# Patient Record
Sex: Male | Born: 1980 | Race: White | Hispanic: No | Marital: Single | Smoking: Former smoker
Health system: Southern US, Community
[De-identification: ages and names within clinical notes are randomized; demographics above are authoritative.]

## PROBLEM LIST (undated history)

## (undated) DIAGNOSIS — S62309A Unspecified fracture of unspecified metacarpal bone, initial encounter for closed fracture: Secondary | ICD-10-CM

## (undated) DIAGNOSIS — S62009A Unspecified fracture of navicular [scaphoid] bone of unspecified wrist, initial encounter for closed fracture: Secondary | ICD-10-CM

## (undated) DIAGNOSIS — J302 Other seasonal allergic rhinitis: Secondary | ICD-10-CM

## (undated) DIAGNOSIS — Z98811 Dental restoration status: Secondary | ICD-10-CM

## (undated) HISTORY — PX: LASIK: SHX215

## (undated) HISTORY — PX: UPPER GI ENDOSCOPY: SHX6162

## (undated) HISTORY — PX: ORIF FOREARM FRACTURE: SHX2124

---

## 2004-06-09 HISTORY — PX: RETINAL DETACHMENT SURGERY: SHX105

## 2005-06-09 HISTORY — PX: FINGER AMPUTATION: SHX636

## 2005-06-09 HISTORY — PX: FINGER REPLANTATION: SHX639

## 2012-09-14 ENCOUNTER — Other Ambulatory Visit: Payer: Self-pay | Admitting: Neurosurgery

## 2012-09-29 ENCOUNTER — Encounter (HOSPITAL_COMMUNITY)
Admission: RE | Admit: 2012-09-29 | Discharge: 2012-09-29 | Disposition: A | Discharge status: Home / Self Care | Payer: 59 | Source: Ambulatory Visit | Attending: Neurosurgery | Admitting: Neurosurgery

## 2012-09-29 ENCOUNTER — Encounter (HOSPITAL_COMMUNITY): Payer: Self-pay

## 2012-09-29 LAB — CBC
MCHC: 35.6 g/dL (ref 30.0–36.0)
MCV: 89.3 fL (ref 78.0–100.0)
Platelets: 287 10*3/uL (ref 150–400)
RDW: 12.8 % (ref 11.5–15.5)
WBC: 8.5 10*3/uL (ref 4.0–10.5)

## 2012-09-29 LAB — SURGICAL PCR SCREEN: MRSA, PCR: NEGATIVE

## 2012-09-29 NOTE — Pre-Procedure Instructions (Addendum)
Omar Rollins  09/29/2012   Your procedure is scheduled on: 10/07/12  Report to [facility] a 800  AM.  Call this number if you have problems the morning of surgery: [phone]   Remember:   Do not eat food or drink liquids after midnight.   Take these medicines the morning of surgery with A SIP OF WATER: none   Do not wear jewelry, make-up or nail polish.  Do not wear lotions, powders, or perfumes. You may wear deodorant.  Do not shave 48 hours prior to surgery. Men may shave face and neck.  Do not bring valuables to the hospital.  Contacts, dentures or bridgework may not be worn into surgery.  Leave suitcase in the car. After surgery it may be brought to your room.  For patients admitted to the hospital, checkout time is 11:00 AM the day of  discharge.   Patients discharged the day of surgery will not be allowed to drive  home.  Name and phone number of your driver:   Special Instructions: Shower using CHG 2 nights before surgery and the night before surgery.  If you shower the day of surgery use CHG.  Use special wash - you have one bottle of CHG for all showers.  You should use approximately 1/3 of the bottle for each shower.   Please read over the following fact sheets that you were given: Pain Booklet, Coughing and Deep Breathing, MRSA Information and Surgical Site Infection Prevention

## 2012-10-06 MED ORDER — CEFAZOLIN SODIUM-DEXTROSE 2-3 GM-% IV SOLR
2.0000 g | INTRAVENOUS | Status: AC
  Administered 2012-10-07: 2 g via INTRAVENOUS
  Filled 2012-10-06: qty 50
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
## 2012-10-07 ENCOUNTER — Encounter (HOSPITAL_COMMUNITY)
Admission: RE | Disposition: A | Discharge status: Home / Self Care | Payer: Self-pay | Source: Ambulatory Visit | Attending: Neurosurgery

## 2012-10-07 ENCOUNTER — Ambulatory Visit (HOSPITAL_COMMUNITY): Payer: 59

## 2012-10-07 ENCOUNTER — Ambulatory Visit (HOSPITAL_COMMUNITY)
Admission: RE | Admit: 2012-10-07 | Discharge: 2012-10-07 | Disposition: A | Discharge status: Home / Self Care | Payer: 59 | Source: Ambulatory Visit | Attending: Neurosurgery | Admitting: Neurosurgery

## 2012-10-07 ENCOUNTER — Ambulatory Visit (HOSPITAL_COMMUNITY): Payer: 59 | Admitting: Certified Registered"

## 2012-10-07 ENCOUNTER — Encounter (HOSPITAL_COMMUNITY): Payer: Self-pay | Admitting: Certified Registered"

## 2012-10-07 ENCOUNTER — Encounter (HOSPITAL_COMMUNITY): Payer: Self-pay | Admitting: *Deleted

## 2012-10-07 DIAGNOSIS — M5030 Other cervical disc degeneration, unspecified cervical region: Secondary | ICD-10-CM | POA: Insufficient documentation

## 2012-10-07 DIAGNOSIS — Z01812 Encounter for preprocedural laboratory examination: Secondary | ICD-10-CM | POA: Insufficient documentation

## 2012-10-07 DIAGNOSIS — M47812 Spondylosis without myelopathy or radiculopathy, cervical region: Secondary | ICD-10-CM | POA: Insufficient documentation

## 2012-10-07 DIAGNOSIS — M5020 Other cervical disc displacement, unspecified cervical region: Secondary | ICD-10-CM | POA: Insufficient documentation

## 2012-10-07 HISTORY — PX: CERVICAL DISC ARTHROPLASTY: SHX587

## 2012-10-07 SURGERY — CERVICAL ANTERIOR DISC ARTHROPLASTY
Anesthesia: General | Site: Spine Cervical | Wound class: Clean

## 2012-10-07 MED ORDER — ONDANSETRON HCL 4 MG/2ML IJ SOLN: 4.0000 mg | INTRAMUSCULAR | Status: DC | PRN

## 2012-10-07 MED ORDER — OXYCODONE HCL 5 MG PO TABS: 5.0000 mg | ORAL_TABLET | Freq: Once | ORAL | Status: DC | PRN

## 2012-10-07 MED ORDER — MEPERIDINE HCL 25 MG/ML IJ SOLN: 6.2500 mg | INTRAMUSCULAR | Status: DC | PRN

## 2012-10-07 MED ORDER — SENNOSIDES-DOCUSATE SODIUM 8.6-50 MG PO TABS
1.0000 | ORAL_TABLET | Freq: Every evening | ORAL | Status: DC | PRN
  Filled 2012-10-07: qty 1

## 2012-10-07 MED ORDER — KCL IN DEXTROSE-NACL 20-5-0.45 MEQ/L-%-% IV SOLN
INTRAVENOUS | Status: DC
  Filled 2012-10-07 (×2): qty 1000

## 2012-10-07 MED ORDER — HYDROMORPHONE HCL PF 1 MG/ML IJ SOLN
INTRAMUSCULAR | Status: DC | PRN
Start: 2012-10-07 — End: 2012-10-07
  Administered 2012-10-07: .5 mg via INTRAVENOUS

## 2012-10-07 MED ORDER — MENTHOL 3 MG MT LOZG: 1.0000 | LOZENGE | OROMUCOSAL | Status: DC | PRN

## 2012-10-07 MED ORDER — SODIUM CHLORIDE 0.9 % IJ SOLN: 3.0000 mL | INTRAMUSCULAR | Status: DC | PRN

## 2012-10-07 MED ORDER — MIDAZOLAM HCL 5 MG/5ML IJ SOLN
INTRAMUSCULAR | Status: DC | PRN
  Administered 2012-10-07: 2 mg via INTRAVENOUS

## 2012-10-07 MED ORDER — KETOROLAC TROMETHAMINE 30 MG/ML IJ SOLN: 30.0000 mg | Freq: Four times a day (QID) | INTRAMUSCULAR | Status: DC

## 2012-10-07 MED ORDER — FLEET ENEMA 7-19 GM/118ML RE ENEM
1.0000 | ENEMA | Freq: Once | RECTAL | Status: DC | PRN
  Filled 2012-10-07: qty 1

## 2012-10-07 MED ORDER — PROPOFOL 10 MG/ML IV BOLUS
INTRAVENOUS | Status: DC | PRN
  Administered 2012-10-07: 200 mg via INTRAVENOUS

## 2012-10-07 MED ORDER — LIDOCAINE-EPINEPHRINE 1 %-1:100000 IJ SOLN
INTRAMUSCULAR | Status: DC | PRN
  Administered 2012-10-07: 2 mL

## 2012-10-07 MED ORDER — SENNA 8.6 MG PO TABS: 1.0000 | ORAL_TABLET | Freq: Two times a day (BID) | ORAL | Status: DC

## 2012-10-07 MED ORDER — OXYCODONE HCL 5 MG PO TABS
ORAL_TABLET | ORAL | Status: AC
  Administered 2012-10-07: 5 mg
  Filled 2012-10-07: qty 1

## 2012-10-07 MED ORDER — MIDAZOLAM HCL 2 MG/2ML IJ SOLN: 0.5000 mg | Freq: Once | INTRAMUSCULAR | Status: DC | PRN

## 2012-10-07 MED ORDER — DEXAMETHASONE SODIUM PHOSPHATE 4 MG/ML IJ SOLN
INTRAMUSCULAR | Status: DC | PRN
  Administered 2012-10-07: 10 mg via INTRAVENOUS

## 2012-10-07 MED ORDER — KETOROLAC TROMETHAMINE 30 MG/ML IJ SOLN: 30.0000 mg | Freq: Once | INTRAMUSCULAR | Status: DC

## 2012-10-07 MED ORDER — ACETAMINOPHEN 325 MG PO TABS: 650.0000 mg | ORAL_TABLET | ORAL | Status: DC | PRN

## 2012-10-07 MED ORDER — PANTOPRAZOLE SODIUM 40 MG IV SOLR
40.0000 mg | Freq: Every day | INTRAVENOUS | Status: DC
  Filled 2012-10-07: qty 40

## 2012-10-07 MED ORDER — PHENOL 1.4 % MT LIQD: 1.0000 | OROMUCOSAL | Status: DC | PRN

## 2012-10-07 MED ORDER — HYDROMORPHONE HCL PF 1 MG/ML IJ SOLN
INTRAMUSCULAR | Status: AC
  Filled 2012-10-07: qty 1

## 2012-10-07 MED ORDER — SODIUM CHLORIDE 0.9 % IJ SOLN: 3.0000 mL | Freq: Two times a day (BID) | INTRAMUSCULAR | Status: DC

## 2012-10-07 MED ORDER — HEMOSTATIC AGENTS (NO CHARGE) OPTIME
TOPICAL | Status: DC | PRN
  Administered 2012-10-07: 1 via TOPICAL

## 2012-10-07 MED ORDER — KETOROLAC TROMETHAMINE 30 MG/ML IJ SOLN
INTRAMUSCULAR | Status: AC
  Administered 2012-10-07: 30 mg
  Filled 2012-10-07: qty 1

## 2012-10-07 MED ORDER — LACTATED RINGERS IV SOLN
INTRAVENOUS | Status: DC | PRN
  Administered 2012-10-07 (×2): via INTRAVENOUS

## 2012-10-07 MED ORDER — CEFAZOLIN SODIUM 1-5 GM-% IV SOLN
1.0000 g | Freq: Three times a day (TID) | INTRAVENOUS | Status: DC
  Filled 2012-10-07 (×2): qty 50

## 2012-10-07 MED ORDER — BUPIVACAINE HCL 0.5 % IJ SOLN
INTRAMUSCULAR | Status: DC | PRN
  Administered 2012-10-07: 2 mL

## 2012-10-07 MED ORDER — HYDROCODONE-ACETAMINOPHEN 5-325 MG PO TABS: 1.0000 | ORAL_TABLET | ORAL | Status: DC | PRN

## 2012-10-07 MED ORDER — FENTANYL CITRATE 0.05 MG/ML IJ SOLN
INTRAMUSCULAR | Status: DC | PRN
  Administered 2012-10-07: 250 ug via INTRAVENOUS

## 2012-10-07 MED ORDER — ROCURONIUM BROMIDE 100 MG/10ML IV SOLN
INTRAVENOUS | Status: DC | PRN
  Administered 2012-10-07: 50 mg via INTRAVENOUS

## 2012-10-07 MED ORDER — DOCUSATE SODIUM 100 MG PO CAPS: 100.0000 mg | ORAL_CAPSULE | Freq: Two times a day (BID) | ORAL | Status: DC

## 2012-10-07 MED ORDER — ONDANSETRON HCL 4 MG/2ML IJ SOLN
INTRAMUSCULAR | Status: DC | PRN
  Administered 2012-10-07: 4 mg via INTRAVENOUS

## 2012-10-07 MED ORDER — ALUM & MAG HYDROXIDE-SIMETH 200-200-20 MG/5ML PO SUSP: 30.0000 mL | Freq: Four times a day (QID) | ORAL | Status: DC | PRN

## 2012-10-07 MED ORDER — HYDROMORPHONE HCL PF 1 MG/ML IJ SOLN
0.2500 mg | INTRAMUSCULAR | Status: DC | PRN
  Administered 2012-10-07: 0.5 mg via INTRAVENOUS

## 2012-10-07 MED ORDER — EPHEDRINE SULFATE 50 MG/ML IJ SOLN
INTRAMUSCULAR | Status: DC | PRN
  Administered 2012-10-07: 10 mg via INTRAVENOUS

## 2012-10-07 MED ORDER — SODIUM CHLORIDE 0.9 % IV SOLN: 250.0000 mL | INTRAVENOUS | Status: DC

## 2012-10-07 MED ORDER — KCL IN DEXTROSE-NACL 20-5-0.45 MEQ/L-%-% IV SOLN
INTRAVENOUS | Status: AC
  Filled 2012-10-07: qty 1000

## 2012-10-07 MED ORDER — MORPHINE SULFATE 2 MG/ML IJ SOLN
1.0000 mg | INTRAMUSCULAR | Status: DC | PRN
  Administered 2012-10-07: 2 mg via INTRAVENOUS
  Filled 2012-10-07: qty 1

## 2012-10-07 MED ORDER — GLYCOPYRROLATE 0.2 MG/ML IJ SOLN
INTRAMUSCULAR | Status: DC | PRN
  Administered 2012-10-07: .5 mg via INTRAVENOUS

## 2012-10-07 MED ORDER — ACETAMINOPHEN 650 MG RE SUPP: 650.0000 mg | RECTAL | Status: DC | PRN

## 2012-10-07 MED ORDER — 0.9 % SODIUM CHLORIDE (POUR BTL) OPTIME
TOPICAL | Status: DC | PRN
  Administered 2012-10-07: 1000 mL

## 2012-10-07 MED ORDER — PROMETHAZINE HCL 25 MG/ML IJ SOLN: 6.2500 mg | INTRAMUSCULAR | Status: DC | PRN

## 2012-10-07 MED ORDER — THROMBIN 5000 UNITS EX SOLR
CUTANEOUS | Status: DC | PRN
  Administered 2012-10-07 (×2): 5000 [IU] via TOPICAL

## 2012-10-07 MED ORDER — DIAZEPAM 5 MG PO TABS: 5.0000 mg | ORAL_TABLET | Freq: Four times a day (QID) | ORAL | Status: DC | PRN

## 2012-10-07 MED ORDER — NEOSTIGMINE METHYLSULFATE 1 MG/ML IJ SOLN
INTRAMUSCULAR | Status: DC | PRN
  Administered 2012-10-07: 4 mg via INTRAVENOUS

## 2012-10-07 MED ORDER — OXYCODONE-ACETAMINOPHEN 5-325 MG PO TABS: 1.0000 | ORAL_TABLET | ORAL | Status: DC | PRN

## 2012-10-07 MED ORDER — OXYCODONE HCL 5 MG/5ML PO SOLN: 5.0000 mg | Freq: Once | ORAL | Status: DC | PRN

## 2012-10-07 MED ORDER — ZOLPIDEM TARTRATE 5 MG PO TABS: 5.0000 mg | ORAL_TABLET | Freq: Every evening | ORAL | Status: DC | PRN

## 2012-10-07 MED ORDER — BISACODYL 10 MG RE SUPP: 10.0000 mg | Freq: Every day | RECTAL | Status: DC | PRN

## 2012-10-07 MED ORDER — LIDOCAINE HCL (CARDIAC) 20 MG/ML IV SOLN
INTRAVENOUS | Status: DC | PRN
  Administered 2012-10-07: 30 mg via INTRAVENOUS

## 2012-10-07 SURGICAL SUPPLY — 63 items
BAG DECANTER FOR FLEXI CONT (MISCELLANEOUS) IMPLANT
BENZOIN TINCTURE PRP APPL 2/3 (GAUZE/BANDAGES/DRESSINGS) IMPLANT
BIT DRILL NEURO 2X3.1 SFT TUCH (MISCELLANEOUS) ×1 IMPLANT
BIT MILLING PRODISC 2.0 STER (BIT) ×2 IMPLANT
BLADE ULTRA TIP 2M (BLADE) ×2 IMPLANT
BUR BARREL STRAIGHT FLUTE 4.0 (BURR) ×2 IMPLANT
CANISTER SUCTION 2500CC (MISCELLANEOUS) ×2 IMPLANT
CLOTH BEACON ORANGE TIMEOUT ST (SAFETY) ×2 IMPLANT
CONT SPEC 4OZ CLIKSEAL STRL BL (MISCELLANEOUS) ×2 IMPLANT
DERMABOND ADHESIVE PROPEN (GAUZE/BANDAGES/DRESSINGS) ×1
DERMABOND ADVANCED (GAUZE/BANDAGES/DRESSINGS)
DERMABOND ADVANCED .7 DNX12 (GAUZE/BANDAGES/DRESSINGS) IMPLANT
DERMABOND ADVANCED .7 DNX6 (GAUZE/BANDAGES/DRESSINGS) ×1 IMPLANT
DISC PRODISC-C MED 5MM (Neuro Prosthesis/Implant) ×2 IMPLANT
DRAPE C-ARM 42X72 X-RAY (DRAPES) ×4 IMPLANT
DRAPE LAPAROTOMY 100X72 PEDS (DRAPES) ×2 IMPLANT
DRAPE MICROSCOPE LEICA (MISCELLANEOUS) ×2 IMPLANT
DRAPE POUCH INSTRU U-SHP 10X18 (DRAPES) ×2 IMPLANT
DRAPE PROXIMA HALF (DRAPES) IMPLANT
DRESSING TELFA 8X3 (GAUZE/BANDAGES/DRESSINGS) IMPLANT
DRILL NEURO 2X3.1 SOFT TOUCH (MISCELLANEOUS) ×2
DRSG PAD ABDOMINAL 8X10 ST (GAUZE/BANDAGES/DRESSINGS) IMPLANT
DURAPREP 6ML APPLICATOR 50/CS (WOUND CARE) ×2 IMPLANT
ELECT COATED BLADE 2.86 ST (ELECTRODE) ×2 IMPLANT
ELECT REM PT RETURN 9FT ADLT (ELECTROSURGICAL) ×2
ELECTRODE REM PT RTRN 9FT ADLT (ELECTROSURGICAL) ×1 IMPLANT
GAUZE SPONGE 4X4 16PLY XRAY LF (GAUZE/BANDAGES/DRESSINGS) IMPLANT
GLOVE BIO SURGEON STRL SZ8 (GLOVE) ×2 IMPLANT
GLOVE BIOGEL PI IND STRL 7.0 (GLOVE) ×1 IMPLANT
GLOVE BIOGEL PI IND STRL 8 (GLOVE) ×2 IMPLANT
GLOVE BIOGEL PI IND STRL 8.5 (GLOVE) ×1 IMPLANT
GLOVE BIOGEL PI INDICATOR 7.0 (GLOVE) ×1
GLOVE BIOGEL PI INDICATOR 8 (GLOVE) ×2
GLOVE BIOGEL PI INDICATOR 8.5 (GLOVE) ×1
GLOVE ECLIPSE 7.5 STRL STRAW (GLOVE) ×6 IMPLANT
GLOVE ECLIPSE 8.0 STRL XLNG CF (GLOVE) ×2 IMPLANT
GLOVE EXAM NITRILE LRG STRL (GLOVE) IMPLANT
GLOVE EXAM NITRILE MD LF STRL (GLOVE) IMPLANT
GLOVE EXAM NITRILE XL STR (GLOVE) IMPLANT
GLOVE EXAM NITRILE XS STR PU (GLOVE) IMPLANT
GOWN BRE IMP SLV AUR LG STRL (GOWN DISPOSABLE) ×4 IMPLANT
GOWN BRE IMP SLV AUR XL STRL (GOWN DISPOSABLE) ×2 IMPLANT
GOWN STRL REIN 2XL LVL4 (GOWN DISPOSABLE) ×4 IMPLANT
INSERTER TIP F/MEDIUM 5MM HEIGHT ×2 IMPLANT
KIT BASIN OR (CUSTOM PROCEDURE TRAY) ×2 IMPLANT
KIT ROOM TURNOVER OR (KITS) ×2 IMPLANT
NEEDLE HYPO 25X1 1.5 SAFETY (NEEDLE) ×2 IMPLANT
NEEDLE SPNL 22GX3.5 QUINCKE BK (NEEDLE) ×2 IMPLANT
NS IRRIG 1000ML POUR BTL (IV SOLUTION) ×2 IMPLANT
PACK LAMINECTOMY NEURO (CUSTOM PROCEDURE TRAY) ×2 IMPLANT
PAD ARMBOARD 7.5X6 YLW CONV (MISCELLANEOUS) ×6 IMPLANT
RUBBERBAND STERILE (MISCELLANEOUS) ×4 IMPLANT
SPONGE GAUZE 4X4 12PLY (GAUZE/BANDAGES/DRESSINGS) IMPLANT
SPONGE INTESTINAL PEANUT (DISPOSABLE) ×2 IMPLANT
SPONGE SURGIFOAM ABS GEL SZ50 (HEMOSTASIS) ×2 IMPLANT
STRIP CLOSURE SKIN 1/2X4 (GAUZE/BANDAGES/DRESSINGS) IMPLANT
SUT VIC AB 3-0 SH 8-18 (SUTURE) ×2 IMPLANT
SYR 20ML ECCENTRIC (SYRINGE) ×2 IMPLANT
TAPE CLOTH 3X10 TAN LF (GAUZE/BANDAGES/DRESSINGS) IMPLANT
TOWEL OR 17X24 6PK STRL BLUE (TOWEL DISPOSABLE) ×2 IMPLANT
TOWEL OR 17X26 10 PK STRL BLUE (TOWEL DISPOSABLE) ×2 IMPLANT
TRAP SPECIMEN MUCOUS 40CC (MISCELLANEOUS) ×2 IMPLANT
WATER STERILE IRR 1000ML POUR (IV SOLUTION) ×2 IMPLANT

## 2012-10-07 NOTE — Interval H&P Note (Signed)
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 After consideration of risks, benefits and other options for treatment, the patient has consented to  Procedure(s) with comments: CERVICAL ANTERIOR DISC ARTHROPLASTY (N/A) - C6-7 Arthroplasty with Prodisc as a surgical intervention .  The patient's history has been reviewed, patient examined, no change in status, stable for surgery.  I have reviewed the patient's chart and labs.  Questions were answered to the patient's satisfaction.     Simpson Paulos D

## 2012-10-07 NOTE — Anesthesia Preprocedure Evaluation (Signed)
Anesthesia Evaluation  Patient identified by MRN, date of birth, ID band Patient awake    Reviewed: Allergy & Precautions, H&P , NPO status , Patient's Chart, lab work & pertinent test results  History of Anesthesia Complications Negative for: history of anesthetic complications  Airway Mallampati: I TM Distance: >3 FB Neck ROM: Full    Dental  (+) Dental Advisory Given   Pulmonary former smoker,  breath sounds clear to auscultation  Pulmonary exam normal       Cardiovascular negative cardio ROS  Rhythm:Regular Rate:Normal     Neuro/Psych negative neurological ROS     GI/Hepatic Neg liver ROS, GERD-  Controlled,  Endo/Other  negative endocrine ROS  Renal/GU negative Renal ROS     Musculoskeletal   Abdominal   Peds  Hematology   Anesthesia Other Findings   Reproductive/Obstetrics                           Anesthesia Physical Anesthesia Plan  ASA: II  Anesthesia Plan: General   Post-op Pain Management:    Induction: Intravenous  Airway Management Planned: Oral ETT  Additional Equipment:   Intra-op Plan:   Post-operative Plan: Extubation in OR  Informed Consent: I have reviewed the patients History and Physical, chart, labs and discussed the procedure including the risks, benefits and alternatives for the proposed anesthesia with the patient or authorized representative who has indicated his/her understanding and acceptance.   Dental advisory given  Plan Discussed with: Surgeon and CRNA  Anesthesia Plan Comments: (Plan routine monitors, GETA)        Anesthesia Quick Evaluation

## 2012-10-07 NOTE — Discharge Summary (Signed)
Physician Discharge Summary  Patient ID: Omar Rollins MRN: [id number] DOB/AGE: 12-27-1980 32 y.o.  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 *   Discharged Condition: good  Hospital Course: Medardo Hassing was admitted for surgery with Dx HNP C6-7. Following uncomplicated arthroplasty C6-7, he recovered in Neuro PACU before transfer to 3500 for observation. He has progressed nicely.  Consults: None  Significant Diagnostic Studies: radiology: X-Ray: Intra-operative  Treatments: surgery: CERVICAL ANTERIOR DISC ARTHROPLASTY (N/A) - Cervical six-seven Arthroplasty with ProdiscC.Anterior cervical decompression with disc arthroplasty    Discharge Exam: Blood pressure 122/82, pulse 65, temperature 98.3 F (36.8 C), temperature source Oral, resp. rate 16, SpO2 99.00%. Alert, conversant. Good strength BUE. Incision with Dermabond, no erythema, swelling, or drainage. Denies dysphagia. Mild left scapular pain persists as expected.     Disposition: Discharge to home. Pt & family verbalize understanding of d/c instructions.   Rx's Percocet 5/325 & Valium 5mg  prn.       Medication List     As of 10/07/2012  6:46 PM    Notice      You have not been prescribed any medications.          Signed: Georgiann Cocker 10/07/2012, 6:46 PM  As above.  Patient doing well.  Strength in left triceps fully improved.

## 2012-10-07 NOTE — Anesthesia Procedure Notes (Signed)
Procedure Name: Intubation Date/Time: 10/07/2012 12:54 PM Performed by: Jerilee Hoh Pre-anesthesia Checklist: Patient identified, Emergency Drugs available, Suction available and Patient being monitored Patient Re-evaluated:Patient Re-evaluated prior to inductionOxygen Delivery Method: Circle system utilized Preoxygenation: Pre-oxygenation with 100% oxygen Intubation Type: IV induction Ventilation: Mask ventilation without difficulty Laryngoscope Size: Mac and 4 Grade View: Grade I Tube type: Oral Tube size: 7.5 mm Number of attempts: 1 Airway Equipment and Method: Stylet Placement Confirmation: ETT inserted through vocal cords under direct vision,  positive ETCO2 and breath sounds checked- equal and bilateral Secured at: 22 cm Tube secured with: Tape Dental Injury: Teeth and Oropharynx as per pre-operative assessment

## 2012-10-07 NOTE — Progress Notes (Signed)
Subjective: Patient reports "I'm doing great!"   Objective: Vital signs in last 24 hours: Temp:  [97.7 F (36.5 C)-98.5 F (36.9 C)] 98.3 F (36.8 C) (05/01 1646) Pulse Rate:  [50-74] 65 (05/01 1646) Resp:  [15-30] 16 (05/01 1646) BP: (120-149)/(73-92) 122/82 mmHg (05/01 1646) SpO2:  [95 %-100 %] 99 % (05/01 1646)  Intake/Output from previous day:   Intake/Output this shift: Total I/O In: 1950 [P.O.:100; I.V.:1850] Out: 50 [Blood:50]  Alert, conversant. Good strength BUE. Incision with Dermabond, no erythema, swelling, or drainage. Denies dysphagia. Mild left scapular pain persists as expected.   Lab Results: No results found for this basename: WBC, HGB, HCT, PLT,  in the last 72 hours BMET No results found for this basename: NA, K, CL, CO2, GLUCOSE, BUN, CREATININE, CALCIUM,  in the last 72 hours  Studies/Results: Dg Cervical Spine 2-3 Views  10/07/2012  *RADIOLOGY REPORT*  Clinical Data: Neck pain  DG C-ARM 61-120 MIN,CERVICAL SPINE - 2-3 VIEW  Technique:  C-arm fluoroscopic images were obtained intraoperatively and submitted for postoperative interpretation. Please see the performing provider's procedural report for the fluoroscopy time utilized.  Comparison: Plain films 06/23/2012  Findings: C6-7 anterior cervical disc arthroplasty with satisfactory position and alignment.  IMPRESSION: As above.   Original Report Authenticated By: Davonna Belling, M.D.    Dg C-arm 61-120 Min  10/07/2012  *RADIOLOGY REPORT*  Clinical Data: Neck pain  DG C-ARM 61-120 MIN,CERVICAL SPINE - 2-3 VIEW  Technique:  C-arm fluoroscopic images were obtained intraoperatively and submitted for postoperative interpretation. Please see the performing provider's procedural report for the fluoroscopy time utilized.  Comparison: Plain films 06/23/2012  Findings: C6-7 anterior cervical disc arthroplasty with satisfactory position and alignment.  IMPRESSION: As above.   Original Report Authenticated By: Davonna Belling, M.D.      Assessment/Plan: Improved    LOS: 0 days  Per Dr. Venetia Maxon, d/c IV, d/c to home. Pt & family verbalize understanding of d/c instructions.  Rx's Percocet 5/325 & Valium 5mg  prn.    Georgiann Cocker 10/07/2012, 6:43 PM

## 2012-10-07 NOTE — Transfer of Care (Signed)
Immediate Anesthesia Transfer of Care Note  Patient: Omar Rollins  Procedure(s) Performed: Procedure(s) with comments: CERVICAL ANTERIOR DISC ARTHROPLASTY (N/A) - Cervical six-seven Arthroplasty with Prodisc  Patient Location: PACU  Anesthesia Type:General  Level of Consciousness: awake  Airway & Oxygen Therapy: Patient Spontanous Breathing and Patient connected to face mask oxygen  Post-op Assessment: Report given to PACU RN and Post -op Vital signs reviewed and stable  Post vital signs: Reviewed and stable  Complications: No apparent anesthesia complications

## 2012-10-07 NOTE — Progress Notes (Signed)
Pt given D/C instructions with Rx's, verbal understanding given. Pt D/C'd home @ 1945 per MD order. Rema Fendt, RN

## 2012-10-07 NOTE — Progress Notes (Signed)
Doing well  DC home

## 2012-10-07 NOTE — Anesthesia Postprocedure Evaluation (Signed)
  Anesthesia Post-op Note  Patient: Omar Rollins  Procedure(s) Performed: Procedure(s) with comments: CERVICAL ANTERIOR DISC ARTHROPLASTY (N/A) - Cervical six-seven Arthroplasty with Prodisc  Patient Location: PACU  Anesthesia Type:General  Level of Consciousness: awake, alert , oriented and patient cooperative  Airway and Oxygen Therapy: Patient Spontanous Breathing  Post-op Pain: mild  Post-op Assessment: Post-op Vital signs reviewed, Patient's Cardiovascular Status Stable, Respiratory Function Stable, Patent Airway, No signs of Nausea or vomiting and Pain level controlled  Post-op Vital Signs: Reviewed and stable  Complications: No apparent anesthesia complications

## 2012-10-07 NOTE — Op Note (Signed)
10/07/2012  2:53 PM  PATIENT:  Omar Rollins  32 y.o. 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,cervical radiculopathy C 67  PROCEDURE:  Procedure(s) with comments: CERVICAL ANTERIOR DISC ARTHROPLASTY (N/A) - Cervical six-seven Arthroplasty with ProdiscC.Anterior cervical decompression with disc arthroplasty  SURGEON:  Surgeon(s) and Role:    * Maeola Harman, MD - Primary    * Clydene Fake, MD - Assisting  PHYSICIAN ASSISTANT:   ASSISTANTS: Poteat, RN   ANESTHESIA:   general  EBL:  Total I/O In: 1500 [I.V.:1500] Out: 50 [Blood:50]  BLOOD ADMINISTERED:none  DRAINS: none   LOCAL MEDICATIONS USED:  LIDOCAINE   SPECIMEN:  No Specimen  DISPOSITION OF SPECIMEN:  N/A  COUNTS:  YES  TOURNIQUET:  * No tourniquets in log *  DICTATION: Patient was brought to operating room and following the smooth and uncomplicated induction of general endotracheal anesthesia his head was placed on a donut head holder and his anterior neck was prepped and draped in usual sterile fashion. An incision was made on the left side of midline after infiltrating the skin and subcutaneous tissues with local lidocaine. The platysmal layer was incised and subplatysmal dissection was performed exposing the anterior border sternocleidomastoid muscle. Using blunt dissection the carotid sheath was kept lateral and trachea and esophagus kept medial exposing the anterior cervical spine. A bent spinal needle was placed it was felt to be the C6-7 level and this was confirmed on intraoperative x-ray. Longus coli muscles were taken down from the anterior cervical spine using electrocautery and key elevator and self-retaining retractor was placed. The interspace was incised and  a thorough discectomy was performed. Synthes 14 mm Distraction pins were placed. The spinal cord dura and both C7 nerve roots were widely decompressed. A large ruptured disc was removed which was compressing the left C 7 nerve root.  Hemostasis was assured. After trial sizing a medium 5 mm Prodisc C implant was sized and countersunk aoppropriately. The milling guide was placed and keel cuts were made.  The chisel was used to clear the keel cuts.  A medium Prodisc C implant was placed.  It's positioning was confirmed with AP and lateral fluoroscopy.  Keel cuts were waxed.  Soft tissues were inspected and found to be in good repair. The wound was irrigated. The platysma layer was closed with 3-0 Vicryl stitches and the skin was reapproximated with 3-0 Vicryl subcuticular stitches. The wound was dressed with Dermabond. Counts were correct at the end of the case. Patient was extubated and taken to recovery in stable and satisfactory condition.  PLAN OF CARE: Admit for overnight observation  PATIENT DISPOSITION:  PACU - hemodynamically stable.   Delay start of Pharmacological VTE agent (>24hrs) due to surgical blood loss or risk of bleeding: yes

## 2012-10-07 NOTE — Progress Notes (Signed)
Awake, alert, conversant.  Full bilateral biceps and triceps strength.  Doing well.

## 2012-10-07 NOTE — Brief Op Note (Signed)
10/07/2012  2:53 PM  PATIENT:  Omar Rollins  31 y.o. 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,cervical radiculopathy C 67  PROCEDURE:  Procedure(s) with comments: CERVICAL ANTERIOR DISC ARTHROPLASTY (N/A) - Cervical six-seven Arthroplasty with ProdiscC.Anterior cervical decompression with disc arthroplasty  SURGEON:  Surgeon(s) and Role:    * Arriyana Rodell, MD - Primary    * James R Hirsch, MD - Assisting  PHYSICIAN ASSISTANT:   ASSISTANTS: Poteat, RN   ANESTHESIA:   general  EBL:  Total I/O In: 1500 [I.V.:1500] Out: 50 [Blood:50]  BLOOD ADMINISTERED:none  DRAINS: none   LOCAL MEDICATIONS USED:  LIDOCAINE   SPECIMEN:  No Specimen  DISPOSITION OF SPECIMEN:  N/A  COUNTS:  YES  TOURNIQUET:  * No tourniquets in log *  DICTATION: Patient was brought to operating room and following the smooth and uncomplicated induction of general endotracheal anesthesia his head was placed on a donut head holder and his anterior neck was prepped and draped in usual sterile fashion. An incision was made on the left side of midline after infiltrating the skin and subcutaneous tissues with local lidocaine. The platysmal layer was incised and subplatysmal dissection was performed exposing the anterior border sternocleidomastoid muscle. Using blunt dissection the carotid sheath was kept lateral and trachea and esophagus kept medial exposing the anterior cervical spine. A bent spinal needle was placed it was felt to be the C6-7 level and this was confirmed on intraoperative x-ray. Longus coli muscles were taken down from the anterior cervical spine using electrocautery and key elevator and self-retaining retractor was placed. The interspace was incised and  a thorough discectomy was performed. Synthes 14 mm Distraction pins were placed. The spinal cord dura and both C7 nerve roots were widely decompressed. A large ruptured disc was removed which was compressing the left C 7 nerve root.  Hemostasis was assured. After trial sizing a medium 5 mm Prodisc C implant was sized and countersunk aoppropriately. The milling guide was placed and keel cuts were made.  The chisel was used to clear the keel cuts.  A medium Prodisc C implant was placed.  It's positioning was confirmed with AP and lateral fluoroscopy.  Keel cuts were waxed.  Soft tissues were inspected and found to be in good repair. The wound was irrigated. The platysma layer was closed with 3-0 Vicryl stitches and the skin was reapproximated with 3-0 Vicryl subcuticular stitches. The wound was dressed with Dermabond. Counts were correct at the end of the case. Patient was extubated and taken to recovery in stable and satisfactory condition.  PLAN OF CARE: Admit for overnight observation  PATIENT DISPOSITION:  PACU - hemodynamically stable.   Delay start of Pharmacological VTE agent (>24hrs) due to surgical blood loss or risk of bleeding: yes  

## 2012-10-07 NOTE — Progress Notes (Signed)
Orthopedic Tech Progress Note Patient Details:  Omar Rollins May 08, 1981 [id number]  Ortho Devices Type of Ortho Device: Soft collar Ortho Device/Splint Location: neck Ortho Device/Splint Interventions: Ordered;Application   [name] 10/07/2012, 3:43 PM

## 2012-10-07 NOTE — Preoperative (Signed)
Beta Blockers   Reason not to administer Beta Blockers:Not Applicable 

## 2012-10-11 ENCOUNTER — Encounter (HOSPITAL_COMMUNITY): Payer: Self-pay | Admitting: Neurosurgery

## 2013-05-09 ENCOUNTER — Other Ambulatory Visit (HOSPITAL_COMMUNITY): Payer: Self-pay | Admitting: Neurosurgery

## 2013-05-09 DIAGNOSIS — M5412 Radiculopathy, cervical region: Secondary | ICD-10-CM

## 2013-05-25 ENCOUNTER — Other Ambulatory Visit: Payer: Self-pay | Admitting: Gastroenterology

## 2013-05-25 DIAGNOSIS — R109 Unspecified abdominal pain: Secondary | ICD-10-CM

## 2013-05-26 ENCOUNTER — Encounter (HOSPITAL_COMMUNITY): Payer: Self-pay | Admitting: Pharmacy Technician

## 2013-05-26 ENCOUNTER — Ambulatory Visit
Admission: RE | Admit: 2013-05-26 | Discharge: 2013-05-26 | Disposition: A | Discharge status: Home / Self Care | Payer: 59 | Source: Ambulatory Visit | Attending: Gastroenterology | Admitting: Gastroenterology

## 2013-05-26 DIAGNOSIS — R109 Unspecified abdominal pain: Secondary | ICD-10-CM

## 2013-05-27 ENCOUNTER — Ambulatory Visit (HOSPITAL_COMMUNITY)
Admission: RE | Admit: 2013-05-27 | Discharge: 2013-05-27 | Disposition: A | Discharge status: Home / Self Care | Payer: 59 | Source: Ambulatory Visit | Attending: Neurosurgery | Admitting: Neurosurgery

## 2013-05-27 DIAGNOSIS — M47812 Spondylosis without myelopathy or radiculopathy, cervical region: Secondary | ICD-10-CM | POA: Insufficient documentation

## 2013-05-27 DIAGNOSIS — M5412 Radiculopathy, cervical region: Secondary | ICD-10-CM

## 2013-05-27 MED ORDER — HYDROCODONE-ACETAMINOPHEN 10-325 MG PO TABS: 1.0000 | ORAL_TABLET | ORAL | Status: DC | PRN

## 2013-05-27 MED ORDER — DIAZEPAM 5 MG PO TABS
ORAL_TABLET | ORAL | Status: AC
  Administered 2013-05-27: 10 mg via ORAL
  Filled 2013-05-27: qty 2

## 2013-05-27 MED ORDER — DIAZEPAM 5 MG PO TABS: 10.0000 mg | ORAL_TABLET | Freq: Once | ORAL | Status: AC

## 2013-05-27 MED ORDER — IOHEXOL 300 MG/ML  SOLN
10.0000 mL | Freq: Once | INTRAMUSCULAR | Status: AC | PRN
  Administered 2013-05-27: 10 mL via INTRATHECAL

## 2013-05-27 MED ORDER — ONDANSETRON HCL 4 MG/2ML IJ SOLN: 4.0000 mg | Freq: Four times a day (QID) | INTRAMUSCULAR | Status: DC | PRN

## 2013-05-27 NOTE — Progress Notes (Signed)
Received pt from Radiology procedure.  Pt denies any discomfort at this time. Pt able to tolerate fluids.

## 2013-05-27 NOTE — Progress Notes (Signed)
Discharge instruction given per Md order.  Pt and Cg able to verbalize understanding.  Pt to car via wheelchair. 

## 2013-05-27 NOTE — Procedures (Signed)
Omnipaque 300, 6 cc, L 34, no complications

## 2013-06-06 ENCOUNTER — Other Ambulatory Visit: Payer: Self-pay | Admitting: Gastroenterology

## 2013-06-07 ENCOUNTER — Ambulatory Visit (HOSPITAL_COMMUNITY)
Admission: RE | Admit: 2013-06-07 | Discharge: 2013-06-07 | Disposition: A | Discharge status: Home / Self Care | Payer: 59 | Source: Ambulatory Visit | Attending: Gastroenterology | Admitting: Gastroenterology

## 2013-06-07 ENCOUNTER — Encounter (HOSPITAL_COMMUNITY)
Admission: RE | Disposition: A | Discharge status: Home / Self Care | Payer: Self-pay | Source: Ambulatory Visit | Attending: Gastroenterology

## 2013-06-07 ENCOUNTER — Encounter (HOSPITAL_COMMUNITY): Payer: Self-pay | Admitting: *Deleted

## 2013-06-07 DIAGNOSIS — K921 Melena: Secondary | ICD-10-CM

## 2013-06-07 DIAGNOSIS — R195 Other fecal abnormalities: Secondary | ICD-10-CM | POA: Insufficient documentation

## 2013-06-07 DIAGNOSIS — R109 Unspecified abdominal pain: Secondary | ICD-10-CM | POA: Insufficient documentation

## 2013-06-07 DIAGNOSIS — D649 Anemia, unspecified: Secondary | ICD-10-CM | POA: Insufficient documentation

## 2013-06-07 DIAGNOSIS — Z87891 Personal history of nicotine dependence: Secondary | ICD-10-CM | POA: Insufficient documentation

## 2013-06-07 HISTORY — PX: COLONOSCOPY: SHX5424

## 2013-06-07 SURGERY — COLONOSCOPY
Anesthesia: Moderate Sedation

## 2013-06-07 MED ORDER — DIPHENHYDRAMINE HCL 50 MG/ML IJ SOLN
INTRAMUSCULAR | Status: DC | PRN
  Administered 2013-06-07: 25 mg via INTRAVENOUS

## 2013-06-07 MED ORDER — FENTANYL CITRATE 0.05 MG/ML IJ SOLN
INTRAMUSCULAR | Status: AC
  Filled 2013-06-07: qty 4

## 2013-06-07 MED ORDER — MIDAZOLAM HCL 5 MG/ML IJ SOLN
INTRAMUSCULAR | Status: AC
  Filled 2013-06-07: qty 1

## 2013-06-07 MED ORDER — FENTANYL CITRATE 0.05 MG/ML IJ SOLN
INTRAMUSCULAR | Status: DC | PRN
  Administered 2013-06-07 (×4): 25 ug via INTRAVENOUS

## 2013-06-07 MED ORDER — SODIUM CHLORIDE 0.9 % IV SOLN
INTRAVENOUS | Status: DC
  Administered 2013-06-07: 500 mL via INTRAVENOUS

## 2013-06-07 MED ORDER — DIPHENHYDRAMINE HCL 50 MG/ML IJ SOLN
INTRAMUSCULAR | Status: AC
  Filled 2013-06-07: qty 1

## 2013-06-07 MED ORDER — MIDAZOLAM HCL 5 MG/5ML IJ SOLN
INTRAMUSCULAR | Status: DC | PRN
  Administered 2013-06-07 (×4): 2 mg via INTRAVENOUS

## 2013-06-07 NOTE — Op Note (Signed)
[facility] [address] [place], [address]   COLONOSCOPY PROCEDURE REPORT  PATIENT: Omar, Rollins  MR#: [id number] BIRTHDATE: 07-11-1980 , 32  yrs. old GENDER: Male ENDOSCOPIST: Dorena Cookey, MD REFERRED BY: PROCEDURE DATE:  06/07/2013 PROCEDURE: ASA CLASS: INDICATIONS:  anemia and heme positive stool MEDICATIONS: 100 mcg fentanyl, 7.5 mg Versed, 50 mg Benadryl.  DESCRIPTION OF PROCEDURE: the future nonvideo colonoscope was inserted into the rectum and advanced to the cecum, confirmed by transillumination McBurney's point visualization of ileocecal valve and appendiceal orifice. The prep was excellent. The cecum a sending transverse descending and sigmoid colon all appeared normal with no masses polyps diverticula or other mucosal abnormalities. The rectum likewise appeared normal and retroflexed view the anus revealed no obvious internal hemorrhoids. Scope was then withdrawn the patient returned to the recovery room in stable condition. He tolerated the procedure well     COMPLICATIONS: None  ENDOSCOPIC IMPRESSION:normal colonoscopy.  RECOMMENDATIONS:discharged to home followup when necessary    _______________________________ eSigned:  Dorena Cookey, MD 06/07/2013 9:14 AM
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
## 2013-06-07 NOTE — Addendum Note (Signed)
Addended by: Dorena Cookey on: 06/07/2013 07:25 AM   Modules accepted: Orders

## 2013-06-08 ENCOUNTER — Encounter (HOSPITAL_COMMUNITY): Payer: Self-pay | Admitting: Gastroenterology

## 2013-10-19 ENCOUNTER — Encounter (HOSPITAL_COMMUNITY): Payer: Self-pay | Admitting: Emergency Medicine

## 2013-10-19 ENCOUNTER — Emergency Department (HOSPITAL_COMMUNITY): Payer: 59

## 2013-10-19 ENCOUNTER — Emergency Department (HOSPITAL_COMMUNITY)
Admission: EM | Admit: 2013-10-19 | Discharge: 2013-10-19 | Disposition: A | Discharge status: Home / Self Care | Payer: 59 | Attending: Emergency Medicine | Admitting: Emergency Medicine

## 2013-10-19 DIAGNOSIS — Z8709 Personal history of other diseases of the respiratory system: Secondary | ICD-10-CM | POA: Insufficient documentation

## 2013-10-19 DIAGNOSIS — S62309A Unspecified fracture of unspecified metacarpal bone, initial encounter for closed fracture: Secondary | ICD-10-CM

## 2013-10-19 DIAGNOSIS — Z79899 Other long term (current) drug therapy: Secondary | ICD-10-CM | POA: Insufficient documentation

## 2013-10-19 DIAGNOSIS — Z87891 Personal history of nicotine dependence: Secondary | ICD-10-CM | POA: Insufficient documentation

## 2013-10-19 DIAGNOSIS — Y9389 Activity, other specified: Secondary | ICD-10-CM | POA: Insufficient documentation

## 2013-10-19 DIAGNOSIS — S62329A Displaced fracture of shaft of unspecified metacarpal bone, initial encounter for closed fracture: Secondary | ICD-10-CM | POA: Insufficient documentation

## 2013-10-19 DIAGNOSIS — Y9289 Other specified places as the place of occurrence of the external cause: Secondary | ICD-10-CM | POA: Insufficient documentation

## 2013-10-19 DIAGNOSIS — S62009A Unspecified fracture of navicular [scaphoid] bone of unspecified wrist, initial encounter for closed fracture: Secondary | ICD-10-CM

## 2013-10-19 HISTORY — DX: Unspecified fracture of unspecified metacarpal bone, initial encounter for closed fracture: S62.309A

## 2013-10-19 HISTORY — DX: Unspecified fracture of navicular (scaphoid) bone of unspecified wrist, initial encounter for closed fracture: S62.009A

## 2013-10-19 MED ORDER — OXYCODONE-ACETAMINOPHEN 5-325 MG PO TABS
1.0000 | ORAL_TABLET | Freq: Once | ORAL | Status: AC
  Administered 2013-10-19: 1 via ORAL
  Filled 2013-10-19: qty 1

## 2013-10-19 MED ORDER — OXYCODONE-ACETAMINOPHEN 5-325 MG PO TABS: 1.0000 | ORAL_TABLET | Freq: Four times a day (QID) | ORAL | Status: DC | PRN

## 2013-10-19 MED ORDER — ONDANSETRON 4 MG PO TBDP
4.0000 mg | ORAL_TABLET | Freq: Once | ORAL | Status: AC
  Administered 2013-10-19: 4 mg via ORAL
  Filled 2013-10-19: qty 1

## 2013-10-19 MED ORDER — HYDROMORPHONE HCL PF 1 MG/ML IJ SOLN
0.5000 mg | Freq: Once | INTRAMUSCULAR | Status: AC
  Administered 2013-10-19: 0.5 mg via INTRAMUSCULAR
  Filled 2013-10-19: qty 1
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
## 2013-10-19 NOTE — ED Notes (Signed)
PT reports increasing pain and swelling to hand. Pt provided with ice. Will follow pain protocol.
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
## 2013-10-19 NOTE — Discharge Instructions (Signed)
Cast or Splint Care Casts and splints support injured limbs and keep bones from moving while they heal. It is important to care for your cast or splint at home.  HOME CARE INSTRUCTIONS  Keep the cast or splint uncovered during the drying period. It can take 24 to 48 hours to dry if it is made of plaster. A fiberglass cast will dry in less than 1 hour.  Do not rest the cast on anything harder than a pillow for the first 24 hours.  Do not put weight on your injured limb or apply pressure to the cast until your health care provider gives you permission.  Keep the cast or splint dry. Wet casts or splints can lose their shape and may not support the limb as well. A wet cast that has lost its shape can also create harmful pressure on your skin when it dries. Also, wet skin can become infected.  Cover the cast or splint with a plastic bag when bathing or when out in the rain or snow. If the cast is on the trunk of the body, take sponge baths until the cast is removed.  If your cast does become wet, dry it with a towel or a blow dryer on the cool setting only.  Keep your cast or splint clean. Soiled casts may be wiped with a moistened cloth.  Do not place any hard or soft foreign objects under your cast or splint, such as cotton, toilet paper, lotion, or powder.  Do not try to scratch the skin under the cast with any object. The object could get stuck inside the cast. Also, scratching could lead to an infection. If itching is a problem, use a blow dryer on a cool setting to relieve discomfort.  Do not trim or cut your cast or remove padding from inside of it.  Exercise all joints next to the injury that are not immobilized by the cast or splint. For example, if you have a long leg cast, exercise the hip joint and toes. If you have an arm cast or splint, exercise the shoulder, elbow, thumb, and fingers.  Elevate your injured arm or leg on 1 or 2 pillows for the first 1 to 3 days to decrease  swelling and pain.It is best if you can comfortably elevate your cast so it is higher than your heart. SEEK MEDICAL CARE IF:   Your cast or splint cracks.  Your cast or splint is too tight or too loose.  You have unbearable itching inside the cast.  Your cast becomes wet or develops a soft spot or area.  You have a bad smell coming from inside your cast.  You get an object stuck under your cast.  Your skin around the cast becomes red or raw.  You have new pain or worsening pain after the cast has been applied. SEEK IMMEDIATE MEDICAL CARE IF:   You have fluid leaking through the cast.  You are unable to move your fingers or toes.  You have discolored (blue or white), cool, painful, or very swollen fingers or toes beyond the cast.  You have tingling or numbness around the injured area.  You have severe pain or pressure under the cast.  You have any difficulty with your breathing or have shortness of breath.  You have chest pain. Document Released: 05/23/2000 Document Revised: 03/16/2013 Document Reviewed: 12/02/2012 [facility] Patient Information 2014 [place], [place].  Hand Fracture, Metacarpals Fractures of metacarpals are breaks in the bones of the hand.  They extend from the knuckles to the wrist. These bones can undergo many types of fractures. There are different ways of treating these fractures, all of which may be correct. TREATMENT  Hand fractures can be treated with:   Non-reduction - The fracture is casted without changing the positions of the fracture (bone pieces) involved. This fracture is usually left in a cast for 4 to 6 weeks or as your caregiver thinks necessary.  Closed reduction - The bones are moved back into position without surgery and then casted.  ORIF (open reduction and internal fixation) - The fracture site is opened and the bone pieces are fixed into place with some type of hardware, such as screws, etc. They are then casted. Your caregiver will  discuss the type of fracture you have and the treatment that should be best for that problem. If surgery is chosen, let your caregivers know about the following.  LET YOUR CAREGIVERS KNOW ABOUT:  Allergies.  Medications you are taking, including herbs, eye drops, over the counter medications, and creams.  Use of steroids (by mouth or creams).  Previous problems with anesthetics or novocaine.  Possibility of pregnancy.  History of blood clots (thrombophlebitis).  History of bleeding or blood problems.  Previous surgeries.  Other health problems. AFTER THE PROCEDURE After surgery, you will be taken to the recovery area where a nurse will watch and check your progress. Once you are awake, stable, and taking fluids well, barring other problems, you'll be allowed to go home. Once home, an ice pack applied to your operative site may help with pain and keep the swelling down. HOME CARE INSTRUCTIONS   Follow your caregiver's instructions as to activities, exercises, physical therapy, and driving a car.  Daily exercise is helpful for keeping range of motion and strength. Exercise as instructed.  To lessen swelling, keep the injured hand elevated above the level of your heart as much as possible.  Apply ice to the injury for 15-20 minutes each hour while awake for the first 2 days. Put the ice in a plastic bag and place a thin towel between the bag of ice and your cast.  Move the fingers of your casted hand several times a day.  If a plaster or fiberglass cast was applied:  Do not try to scratch the skin under the cast using a sharp or pointed object.  Check the skin around the cast every day. You may put lotion on red or sore areas.  Keep your cast dry. Your cast can be protected during bathing with a plastic bag. Do not put your cast into the water.  If a plaster splint was applied:  Wear your splint for as long as directed by your caregiver or until seen again.  Do not get your  splint wet. Protect it during bathing with a plastic bag.  You may loosen the elastic bandage around the splint if your fingers start to get numb, tingle, get cold or turn blue.  Do not put pressure on your cast or splint; this may cause it to break. Especially, do not lean plaster casts on hard surfaces for 24 hours after application.  Take medications as directed by your caregiver.  Only take over-the-counter or prescription medicines for pain, discomfort, or fever as directed by your caregiver.  Follow-up as provided by your caregiver. This is very important in order to avoid permanent injury or disability and chronic pain. SEEK MEDICAL CARE IF:   Increased bleeding (more than a small  spot) from beneath your cast or splint if there is beneath the cast as with an open reduction.  Redness, swelling, or increasing pain in the wound or from beneath your cast or splint.  Pus coming from wound or from beneath your cast or splint.  An unexplained oral temperature above 102 F (38.9 C) develops, or as your caregiver suggests.  A foul smell coming from the wound or dressing or from beneath your cast or splint.  You have a problem moving any of your fingers. SEEK IMMEDIATE MEDICAL CARE IF:   You develop a rash  You have difficulty breathing  You have any allergy problems If you do not have a window in your cast for observing the wound, a discharge or minor bleeding may show up as a stain on the outside of your cast. Report these findings to your caregiver. MAKE SURE YOU:   Understand these instructions.  Will watch your condition.  Will get help right away if you are not doing well or get worse. Document Released: 05/26/2005 Document Revised: 08/18/2011 Document Reviewed: 01/13/2008 [facility] Patient Information 2014 [place]ExitCare, [place]LLC.

## 2013-10-19 NOTE — Progress Notes (Signed)
Orthopedic Tech Progress Note Patient Details:  Omar BumpStephen N Pokorski 04/08/1981 [id number]  Ortho Devices Type of Ortho Device: Ace wrap;Volar splint Ortho Device/Splint Location: RUE Ortho Device/Splint Interventions: Ordered;Application   Jennye MoccasinAnthony Craig Roshaun Pound 10/19/2013, 11:07 PM
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
## 2013-10-19 NOTE — ED Notes (Signed)
Pt. Transported to xray 

## 2013-10-20 ENCOUNTER — Other Ambulatory Visit: Payer: Self-pay | Admitting: Orthopedic Surgery

## 2013-10-20 ENCOUNTER — Encounter (HOSPITAL_BASED_OUTPATIENT_CLINIC_OR_DEPARTMENT_OTHER): Payer: Self-pay | Admitting: *Deleted
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
## 2013-10-24 ENCOUNTER — Encounter (HOSPITAL_BASED_OUTPATIENT_CLINIC_OR_DEPARTMENT_OTHER): Payer: Self-pay | Admitting: Certified Registered"

## 2013-10-24 ENCOUNTER — Encounter (HOSPITAL_BASED_OUTPATIENT_CLINIC_OR_DEPARTMENT_OTHER)
Admission: RE | Disposition: A | Discharge status: Home / Self Care | Payer: Self-pay | Source: Ambulatory Visit | Attending: Orthopedic Surgery

## 2013-10-24 ENCOUNTER — Ambulatory Visit (HOSPITAL_BASED_OUTPATIENT_CLINIC_OR_DEPARTMENT_OTHER): Payer: 59 | Admitting: Certified Registered"

## 2013-10-24 ENCOUNTER — Encounter (HOSPITAL_BASED_OUTPATIENT_CLINIC_OR_DEPARTMENT_OTHER): Payer: 59 | Admitting: Certified Registered"

## 2013-10-24 ENCOUNTER — Ambulatory Visit (HOSPITAL_BASED_OUTPATIENT_CLINIC_OR_DEPARTMENT_OTHER)
Admission: RE | Admit: 2013-10-24 | Discharge: 2013-10-24 | Disposition: A | Discharge status: Home / Self Care | Payer: 59 | Source: Ambulatory Visit | Attending: Orthopedic Surgery | Admitting: Orthopedic Surgery

## 2013-10-24 ENCOUNTER — Ambulatory Visit (HOSPITAL_COMMUNITY): Payer: 59

## 2013-10-24 DIAGNOSIS — S62009A Unspecified fracture of navicular [scaphoid] bone of unspecified wrist, initial encounter for closed fracture: Secondary | ICD-10-CM | POA: Insufficient documentation

## 2013-10-24 DIAGNOSIS — J301 Allergic rhinitis due to pollen: Secondary | ICD-10-CM | POA: Insufficient documentation

## 2013-10-24 DIAGNOSIS — Z87891 Personal history of nicotine dependence: Secondary | ICD-10-CM | POA: Insufficient documentation

## 2013-10-24 DIAGNOSIS — Y9355 Activity, bike riding: Secondary | ICD-10-CM | POA: Insufficient documentation

## 2013-10-24 DIAGNOSIS — Y9289 Other specified places as the place of occurrence of the external cause: Secondary | ICD-10-CM | POA: Insufficient documentation

## 2013-10-24 DIAGNOSIS — S62329A Displaced fracture of shaft of unspecified metacarpal bone, initial encounter for closed fracture: Secondary | ICD-10-CM | POA: Insufficient documentation

## 2013-10-24 DIAGNOSIS — X58XXXA Exposure to other specified factors, initial encounter: Secondary | ICD-10-CM | POA: Insufficient documentation

## 2013-10-24 HISTORY — PX: ORIF SCAPHOID FRACTURE: SHX2130

## 2013-10-24 HISTORY — DX: Other seasonal allergic rhinitis: J30.2

## 2013-10-24 HISTORY — DX: Unspecified fracture of unspecified metacarpal bone, initial encounter for closed fracture: S62.309A

## 2013-10-24 HISTORY — DX: Unspecified fracture of navicular (scaphoid) bone of unspecified wrist, initial encounter for closed fracture: S62.009A

## 2013-10-24 HISTORY — PX: OPEN REDUCTION INTERNAL FIXATION (ORIF) METACARPAL: SHX6234

## 2013-10-24 HISTORY — DX: Dental restoration status: Z98.811

## 2013-10-24 LAB — POCT HEMOGLOBIN-HEMACUE: Hemoglobin: 13.8 g/dL (ref 13.0–17.0)

## 2013-10-24 SURGERY — OPEN REDUCTION INTERNAL FIXATION (ORIF) SCAPHOID FRACTURE
Anesthesia: General | Laterality: Right

## 2013-10-24 MED ORDER — MIDAZOLAM HCL 2 MG/2ML IJ SOLN
INTRAMUSCULAR | Status: AC
  Filled 2013-10-24: qty 2

## 2013-10-24 MED ORDER — OXYCODONE-ACETAMINOPHEN 5-325 MG PO TABS: 1.0000 | ORAL_TABLET | ORAL | Status: DC | PRN

## 2013-10-24 MED ORDER — ONDANSETRON HCL 4 MG/2ML IJ SOLN: 4.0000 mg | Freq: Once | INTRAMUSCULAR | Status: DC | PRN

## 2013-10-24 MED ORDER — MIDAZOLAM HCL 2 MG/ML PO SYRP: 12.0000 mg | ORAL_SOLUTION | Freq: Once | ORAL | Status: DC | PRN

## 2013-10-24 MED ORDER — LACTATED RINGERS IV SOLN: INTRAVENOUS | Status: DC

## 2013-10-24 MED ORDER — HYDROCODONE-ACETAMINOPHEN 5-325 MG PO TABS: 1.0000 | ORAL_TABLET | ORAL | Status: DC | PRN

## 2013-10-24 MED ORDER — 0.9 % SODIUM CHLORIDE (POUR BTL) OPTIME
TOPICAL | Status: DC | PRN
  Administered 2013-10-24: 500 mL

## 2013-10-24 MED ORDER — HYDROMORPHONE HCL PF 1 MG/ML IJ SOLN: 0.5000 mg | INTRAMUSCULAR | Status: DC | PRN

## 2013-10-24 MED ORDER — ONDANSETRON HCL 4 MG/2ML IJ SOLN
INTRAMUSCULAR | Status: DC | PRN
Start: 2013-10-24 — End: 2013-10-24
  Administered 2013-10-24: 4 mg via INTRAVENOUS

## 2013-10-24 MED ORDER — FENTANYL CITRATE 0.05 MG/ML IJ SOLN
INTRAMUSCULAR | Status: AC
  Filled 2013-10-24: qty 6

## 2013-10-24 MED ORDER — FENTANYL CITRATE 0.05 MG/ML IJ SOLN
50.0000 ug | INTRAMUSCULAR | Status: DC | PRN
  Administered 2013-10-24: 100 ug via INTRAVENOUS

## 2013-10-24 MED ORDER — CEFAZOLIN SODIUM-DEXTROSE 2-3 GM-% IV SOLR
2.0000 g | INTRAVENOUS | Status: AC
  Administered 2013-10-24: 2 g via INTRAVENOUS

## 2013-10-24 MED ORDER — MIDAZOLAM HCL 5 MG/5ML IJ SOLN
INTRAMUSCULAR | Status: DC | PRN
  Administered 2013-10-24: 2 mg via INTRAVENOUS

## 2013-10-24 MED ORDER — HYDROMORPHONE HCL PF 1 MG/ML IJ SOLN: 0.2500 mg | INTRAMUSCULAR | Status: DC | PRN

## 2013-10-24 MED ORDER — CEFAZOLIN SODIUM-DEXTROSE 2-3 GM-% IV SOLR
INTRAVENOUS | Status: AC
  Filled 2013-10-24: qty 50

## 2013-10-24 MED ORDER — CHLORHEXIDINE GLUCONATE 4 % EX LIQD: 60.0000 mL | Freq: Once | CUTANEOUS | Status: DC

## 2013-10-24 MED ORDER — LIDOCAINE HCL (CARDIAC) 20 MG/ML IV SOLN
INTRAVENOUS | Status: DC | PRN
  Administered 2013-10-24: 30 mg via INTRAVENOUS

## 2013-10-24 MED ORDER — OXYCODONE HCL 5 MG PO TABS: 5.0000 mg | ORAL_TABLET | Freq: Once | ORAL | Status: DC | PRN

## 2013-10-24 MED ORDER — PROPOFOL 10 MG/ML IV BOLUS
INTRAVENOUS | Status: DC | PRN
  Administered 2013-10-24: 200 mg via INTRAVENOUS

## 2013-10-24 MED ORDER — LACTATED RINGERS IV SOLN
INTRAVENOUS | Status: DC
  Administered 2013-10-24 (×2): via INTRAVENOUS

## 2013-10-24 MED ORDER — DEXAMETHASONE SODIUM PHOSPHATE 10 MG/ML IJ SOLN
INTRAMUSCULAR | Status: DC | PRN
  Administered 2013-10-24: 10 mg via INTRAVENOUS

## 2013-10-24 MED ORDER — OXYCODONE HCL 5 MG/5ML PO SOLN: 5.0000 mg | Freq: Once | ORAL | Status: DC | PRN

## 2013-10-24 MED ORDER — MIDAZOLAM HCL 2 MG/2ML IJ SOLN
1.0000 mg | INTRAMUSCULAR | Status: DC | PRN
  Administered 2013-10-24: 2 mg via INTRAVENOUS

## 2013-10-24 MED ORDER — FENTANYL CITRATE 0.05 MG/ML IJ SOLN
INTRAMUSCULAR | Status: AC
  Filled 2013-10-24: qty 2

## 2013-10-24 SURGICAL SUPPLY — 69 items
BANDAGE COBAN STERILE 2 (GAUZE/BANDAGES/DRESSINGS) IMPLANT
BIT DRILL 1.1 (BIT) ×1
BIT DRILL 60X20X1.1XQC TMX (BIT) ×1 IMPLANT
BIT DRILL MINI LNG ACUTRAK 2 (BIT) ×1 IMPLANT
BIT DRL 60X20X1.1XQC TMX (BIT) ×1
BLADE 15 SAFETY STRL DISP (BLADE) ×2 IMPLANT
BLADE MINI RND TIP GREEN BEAV (BLADE) IMPLANT
BNDG COHESIVE 4X5 TAN STRL (GAUZE/BANDAGES/DRESSINGS) ×2 IMPLANT
BNDG ESMARK 4X9 LF (GAUZE/BANDAGES/DRESSINGS) ×2 IMPLANT
BNDG GAUZE ELAST 4 BULKY (GAUZE/BANDAGES/DRESSINGS) ×4 IMPLANT
CANISTER SUCTION 1200CC (MISCELLANEOUS) ×2 IMPLANT
CHLORAPREP W/TINT 26ML (MISCELLANEOUS) ×2 IMPLANT
CORDS BIPOLAR (ELECTRODE) ×2 IMPLANT
COVER MAYO STAND STRL (DRAPES) ×2 IMPLANT
COVER TABLE BACK 60X90 (DRAPES) ×2 IMPLANT
CUFF TOURNIQUET SINGLE 18IN (TOURNIQUET CUFF) ×2 IMPLANT
DRAPE C-ARM 42X72 X-RAY (DRAPES) ×2 IMPLANT
DRAPE EXTREMITY T 121X128X90 (DRAPE) ×2 IMPLANT
DRAPE SURG 17X23 STRL (DRAPES) ×2 IMPLANT
DRILL MINI LNG ACUTRAK 2 (BIT) ×2
DRSG EMULSION OIL 3X3 NADH (GAUZE/BANDAGES/DRESSINGS) ×2 IMPLANT
GAUZE SPONGE 4X4 12PLY STRL (GAUZE/BANDAGES/DRESSINGS) ×2 IMPLANT
GLOVE BIO SURGEON STRL SZ7.5 (GLOVE) ×2 IMPLANT
GLOVE BIOGEL PI IND STRL 8 (GLOVE) ×1 IMPLANT
GLOVE BIOGEL PI INDICATOR 8 (GLOVE) ×1
GOWN STRL REUS W/ TWL LRG LVL3 (GOWN DISPOSABLE) ×2 IMPLANT
GOWN STRL REUS W/TWL LRG LVL3 (GOWN DISPOSABLE) ×2
K-WIRE .045X6 DBL TRO NS (WIRE) ×2
K-WIRE DBL TRONS .035X6 (WIRE) ×2
KWIRE .045X6 DBL TRO NS (WIRE) ×1 IMPLANT
KWIRE DBL TRONS .035X6 (WIRE) ×1 IMPLANT
LOCK SCREW 1.5X15MM (Screw) ×2 IMPLANT
LOCK SCREW 1.5X20MM (Screw) ×2 IMPLANT
NEEDLE HYPO 22GX1.5 SAFETY (NEEDLE) IMPLANT
NS IRRIG 1000ML POUR BTL (IV SOLUTION) ×2 IMPLANT
PACK BASIN DAY SURGERY FS (CUSTOM PROCEDURE TRAY) ×2 IMPLANT
PADDING CAST ABS 4INX4YD NS (CAST SUPPLIES) ×1
PADDING CAST ABS COTTON 4X4 ST (CAST SUPPLIES) ×1 IMPLANT
PLATE LOCKING 1.5 Y SHAPE (Plate) ×2 IMPLANT
PLATE STRAIGHT LOCK 1.5 (Plate) ×2 IMPLANT
RUBBERBAND STERILE (MISCELLANEOUS) IMPLANT
SCREW 1.5X18MM (Screw) ×1 IMPLANT
SCREW ACUTRAK 2 MINI 20MM (Screw) ×2 IMPLANT
SCREW BN 18X1.5XST NONLOCK (Screw) ×1 IMPLANT
SCREW L 1.5X12 (Screw) ×6 IMPLANT
SCREW L 1.5X14 (Screw) ×4 IMPLANT
SCREW LOCK 1.5X15MM (Screw) ×1 IMPLANT
SCREW LOCK 1.5X20MM (Screw) ×1 IMPLANT
SCREW LOCKING 1.5X11MM (Screw) ×2 IMPLANT
SCREW LOCKING 1.5X13MM (Screw) ×2 IMPLANT
SCREW LOCKING 1.5X16 (Screw) ×4 IMPLANT
SCREW LOCKING 1.5X18MM (Screw) ×2 IMPLANT
SCREW NL 1.5X13 (Screw) ×2 IMPLANT
SCREW NONIOC 1.5 10M (Screw) ×2 IMPLANT
SCREW NONIOC 1.5 14M (Screw) ×4 IMPLANT
SLEEVE SCD COMPRESS KNEE MED (MISCELLANEOUS) ×2 IMPLANT
SLING ARM IMMOBILIZER LRG (SOFTGOODS) ×2 IMPLANT
SPLINT PLASTER CAST XFAST 3X15 (CAST SUPPLIES) ×9 IMPLANT
SPLINT PLASTER XTRA FASTSET 3X (CAST SUPPLIES) ×9
STOCKINETTE 4X48 STRL (DRAPES) ×2 IMPLANT
SUCTION FRAZIER TIP 10 FR DISP (SUCTIONS) ×2 IMPLANT
SUT VICRYL RAPIDE 4-0 (SUTURE) IMPLANT
SUT VICRYL RAPIDE 4/0 PS 2 (SUTURE) ×2 IMPLANT
SYR BULB 3OZ (MISCELLANEOUS) ×2 IMPLANT
SYRINGE 10CC LL (SYRINGE) IMPLANT
TOWEL OR 17X24 6PK STRL BLUE (TOWEL DISPOSABLE) ×2 IMPLANT
TOWEL OR NON WOVEN STRL DISP B (DISPOSABLE) IMPLANT
TUBE CONNECTING 20X1/4 (TUBING) ×2 IMPLANT
UNDERPAD 30X30 INCONTINENT (UNDERPADS AND DIAPERS) ×2 IMPLANT

## 2013-10-24 NOTE — Anesthesia Preprocedure Evaluation (Signed)
Anesthesia Evaluation  Patient identified by MRN, date of birth, ID band Patient awake    Reviewed: Allergy & Precautions, H&P , NPO status , Patient's Chart, lab work & pertinent test results  Airway Mallampati: I  TM Distance: >3 FB Neck ROM: Full    Dental  (+) Teeth Intact, Dental Advisory Given   Pulmonary former smoker,  breath sounds clear to auscultation        Cardiovascular Rhythm:Regular Rate:Normal     Neuro/Psych    GI/Hepatic   Endo/Other    Renal/GU      Musculoskeletal   Abdominal   Peds  Hematology   Anesthesia Other Findings   Reproductive/Obstetrics                             Anesthesia Physical Anesthesia Plan  ASA: I  Anesthesia Plan: General   Post-op Pain Management:    Induction: Intravenous  Airway Management Planned: LMA  Additional Equipment:   Intra-op Plan:   Post-operative Plan: Extubation in OR  Informed Consent: I have reviewed the patients History and Physical, chart, labs and discussed the procedure including the risks, benefits and alternatives for the proposed anesthesia with the patient or authorized representative who has indicated his/her understanding and acceptance.   Dental advisory given  Plan Discussed with: CRNA, Anesthesiologist and Surgeon  Anesthesia Plan Comments:         Anesthesia Quick Evaluation  
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
## 2013-10-24 NOTE — Anesthesia Postprocedure Evaluation (Signed)
  Anesthesia Post-op Note  Patient: Darius BumpStephen N Angus  Procedure(s) Performed: Procedure(s) with comments: OPEN REDUCTION INTERNAL FIXATION (ORIF) RIGHT SCAPHOID FRACTURE (Right) - ANESTHESIA:  GENERAL/PRE-OP BLOCK OPEN REDUCTION INTERNAL FIXATION (ORIF) RIGHT TWO THROUGH FOUR METACARPAL FRACTURES (Right)  Patient Location: PACU  Anesthesia Type:GA combined with regional for post-op pain  Level of Consciousness: awake, alert  and oriented  Airway and Oxygen Therapy: Patient Spontanous Breathing  Post-op Pain: none  Post-op Assessment: Post-op Vital signs reviewed  Post-op Vital Signs: Reviewed  Last Vitals:  Filed Vitals:   10/24/13 1230  BP: 120/76  Pulse: 71  Temp:   Resp: 18    Complications: No apparent anesthesia complications

## 2013-10-24 NOTE — Anesthesia Procedure Notes (Signed)
Procedure Name: LMA Insertion Date/Time: 10/24/2013 9:58 AM Performed by: Donice Alperin Pre-anesthesia Checklist: Patient identified, Emergency Drugs available, Suction available and Patient being monitored Patient Re-evaluated:Patient Re-evaluated prior to inductionOxygen Delivery Method: Circle System Utilized Preoxygenation: Pre-oxygenation with 100% oxygen Intubation Type: IV induction Ventilation: Mask ventilation without difficulty LMA: LMA inserted LMA Size: 5.0 Number of attempts: 1 Airway Equipment and Method: bite block Placement Confirmation: positive ETCO2 Tube secured with: Tape Dental Injury: Teeth and Oropharynx as per pre-operative assessment

## 2013-10-24 NOTE — Progress Notes (Signed)
Assisted Dr. Crews with right, ultrasound guided, supraclavicular block. Side rails up, monitors on throughout procedure. See vital signs in flow sheet. Tolerated Procedure well. 

## 2013-10-24 NOTE — ED Provider Notes (Signed)
Medical screening examination/treatment/procedure(s) were performed by non-physician practitioner and as supervising physician I was immediately available for consultation/collaboration.  Caleel Kiner T Mckenzee Beem, MD 10/24/13 0726 

## 2013-10-24 NOTE — Transfer of Care (Signed)
Immediate Anesthesia Transfer of Care Note  Patient: Omar Rollins  Procedure(s) Performed: Procedure(s) with comments: OPEN REDUCTION INTERNAL FIXATION (ORIF) RIGHT SCAPHOID FRACTURE (Right) - ANESTHESIA:  GENERAL/PRE-OP BLOCK OPEN REDUCTION INTERNAL FIXATION (ORIF) RIGHT TWO THROUGH FOUR METACARPAL FRACTURES (Right)  Patient Location: PACU  Anesthesia Type:GA combined with regional for post-op pain  Level of Consciousness: awake, alert , oriented and patient cooperative  Airway & Oxygen Therapy: Patient Spontanous Breathing and Patient connected to face mask oxygen  Post-op Assessment: Report given to PACU RN and Post -op Vital signs reviewed and stable  Post vital signs: Reviewed and stable  Complications: No apparent anesthesia complications

## 2013-10-24 NOTE — Interval H&P Note (Signed)
History and Physical Interval Note:  10/24/2013 9:39 AM  Darius BumpStephen N Cavanagh  has presented today for surgery, with the diagnosis of RIGHT HAND 2-4 METACARPAL FRACTURES/SCAPHOID FRACTURE  The various methods of treatment have been discussed with the patient and family. After consideration of risks, benefits and other options for treatment, the patient has consented to  Procedure(s) with comments: OPEN REDUCTION INTERNAL FIXATION (ORIF) RIGHT SCAPHOID FRACTURE (Right) - ANESTHESIA:  GENERAL/PRE-OP BLOCK OPEN REDUCTION INTERNAL FIXATION (ORIF) RIGHT 2-4 METACARPALFRACTURES (Right) as a surgical intervention .  The patient's history has been reviewed, patient examined, no change in status, stable for surgery.  I have reviewed the patient's chart and labs.  Questions were answered to the patient's satisfaction.     Jodi Marbleavid A Jamile Rekowski

## 2013-10-24 NOTE — Discharge Instructions (Signed)
Discharge Instructions   You have a dressing with a plaster splint incorporated in it. Move your fingers as much as possible, making a full fist and fully opening the fist. Elevate your hand to reduce pain & swelling of the digits.  Ice over the operative site may be helpful to reduce pain & swelling.  DO NOT USE HEAT. Pain medicine has been prescribed for you.  Use your medicine as needed over the first 48 hours, and then you can begin to taper your use.  You may use Tylenol in place of your prescribed pain medication, but not IN ADDITION to it. Leave the dressing in place until you return to our office.  You may shower, but keep the bandage clean & dry.  You may drive a car when you are off of prescription pain medications and can safely control your vehicle with both hands. Call the office today or tomorrow to arrange for your next appointment to be 7-10 days from now.   Please call [phone] during normal business hours or [phone] after hours for any problems. Including the following:  - excessive redness of the incisions - drainage for more than 4 days - fever of more than 101.5 F  *Please note that pain medications will not be refilled after hours or on weekends.  Regional Anesthesia Blocks  1. Numbness or the inability to move the "blocked" extremity may last from 3-48 hours after placement. The length of time depends on the medication injected and your individual response to the medication. If the numbness is not going away after 48 hours, call your surgeon.  2. The extremity that is blocked will need to be protected until the numbness is gone and the  Strength has returned. Because you cannot feel it, you will need to take extra care to avoid injury. Because it may be weak, you may have difficulty moving it or using it. You may not know what position it is in without looking at it while the block is in effect.  3. For blocks in the legs and feet, returning to weight  bearing and walking needs to be done carefully. You will need to wait until the numbness is entirely gone and the strength has returned. You should be able to move your leg and foot normally before you try and bear weight or walk. You will need someone to be with you when you first try to ensure you do not fall and possibly risk injury.  4. Bruising and tenderness at the needle site are common side effects and will resolve in a few days.  5. Persistent numbness or new problems with movement should be communicated to the surgeon or the [facility] [place] [phone])/ [facility] [phone]).  Post Anesthesia Home Care Instructions  Activity: Get plenty of rest for the remainder of the day. A responsible adult should stay with you for 24 hours following the procedure.  For the next 24 hours, DO NOT: -Drive a car -Advertising copywriterperate machinery -Drink alcoholic beverages -Take any medication unless instructed by your physician -Make any legal decisions or sign important papers.  Meals: Start with liquid foods such as gelatin or soup. Progress to regular foods as tolerated. Avoid greasy, spicy, heavy foods. If nausea and/or vomiting occur, drink only clear liquids until the nausea and/or vomiting subsides. Call your physician if vomiting continues.  Special Instructions/Symptoms: Your throat may feel dry or sore from the anesthesia or the breathing tube placed in your throat during surgery.  If this causes discomfort, gargle with warm salt water. The discomfort should disappear within 24 hours.

## 2013-10-26 ENCOUNTER — Encounter (HOSPITAL_BASED_OUTPATIENT_CLINIC_OR_DEPARTMENT_OTHER): Payer: Self-pay | Admitting: Orthopedic Surgery

## 2019-12-23 ENCOUNTER — Other Ambulatory Visit: Payer: Self-pay

## 2019-12-23 ENCOUNTER — Encounter (HOSPITAL_COMMUNITY): Payer: Self-pay

## 2019-12-23 DIAGNOSIS — N200 Calculus of kidney: Secondary | ICD-10-CM | POA: Insufficient documentation

## 2019-12-23 DIAGNOSIS — R109 Unspecified abdominal pain: Secondary | ICD-10-CM | POA: Diagnosis present

## 2019-12-23 LAB — LIPASE, BLOOD: Lipase: 20 U/L (ref 11–51)

## 2019-12-23 LAB — COMPREHENSIVE METABOLIC PANEL
ALT: 23 U/L (ref 0–44)
AST: 22 U/L (ref 15–41)
Albumin: 4.6 g/dL (ref 3.5–5.0)
Alkaline Phosphatase: 56 U/L (ref 38–126)
Anion gap: 10 (ref 5–15)
BUN: 15 mg/dL (ref 6–20)
CO2: 24 mmol/L (ref 22–32)
Calcium: 9.1 mg/dL (ref 8.9–10.3)
Chloride: 103 mmol/L (ref 98–111)
Creatinine, Ser: 0.99 mg/dL (ref 0.61–1.24)
GFR calc Af Amer: >60 mL/min (ref >60)
GFR calc non Af Amer: >60 mL/min (ref >60)
Glucose, Bld: 119 mg/dL — ABNORMAL HIGH (ref 70–99)
Potassium: 3.6 mmol/L (ref 3.5–5.1)
Sodium: 137 mmol/L (ref 135–145)
Total Bilirubin: 0.6 mg/dL (ref 0.3–1.2)
Total Protein: 7.5 g/dL (ref 6.5–8.1)

## 2019-12-23 LAB — CBC
HCT: 43.4 % (ref 39.0–52.0)
Hemoglobin: 14.5 g/dL (ref 13.0–17.0)
MCH: 32.2 pg (ref 26.0–34.0)
MCHC: 33.4 g/dL (ref 30.0–36.0)
MCV: 96.2 fL (ref 80.0–100.0)
Platelets: 311 10*3/uL (ref 150–400)
RBC: 4.51 MIL/uL (ref 4.22–5.81)
RDW: 13.3 % (ref 11.5–15.5)
WBC: 8 10*3/uL (ref 4.0–10.5)
nRBC: 0 % (ref 0.0–0.2)

## 2019-12-23 NOTE — ED Triage Notes (Signed)
Pt c/o back pain, nausea, flank pain starting tonight at dinner.

## 2019-12-24 ENCOUNTER — Emergency Department (HOSPITAL_COMMUNITY): Payer: No Typology Code available for payment source

## 2019-12-24 ENCOUNTER — Emergency Department (HOSPITAL_COMMUNITY)
Admission: EM | Admit: 2019-12-24 | Discharge: 2019-12-24 | Disposition: A | Discharge status: Home / Self Care | Payer: No Typology Code available for payment source | Attending: Emergency Medicine | Admitting: Emergency Medicine

## 2019-12-24 DIAGNOSIS — N200 Calculus of kidney: Secondary | ICD-10-CM

## 2019-12-24 LAB — URINALYSIS, ROUTINE W REFLEX MICROSCOPIC
Bacteria, UA: NONE SEEN
Bilirubin Urine: NEGATIVE
Glucose, UA: NEGATIVE mg/dL
Ketones, ur: NEGATIVE mg/dL
Leukocytes,Ua: NEGATIVE
Nitrite: NEGATIVE
Protein, ur: 100 mg/dL — AB
RBC / HPF: >50 RBC/hpf — ABNORMAL HIGH (ref 0–5)
Specific Gravity, Urine: 1.025 (ref 1.005–1.030)
WBC, UA: >50 WBC/hpf — ABNORMAL HIGH (ref 0–5)
pH: 5 (ref 5.0–8.0)

## 2019-12-24 MED ORDER — ONDANSETRON 4 MG PO TBDP: 4.0000 mg | ORAL_TABLET | Freq: Three times a day (TID) | ORAL | 0 refills | Status: AC | PRN

## 2019-12-24 MED ORDER — OXYCODONE-ACETAMINOPHEN 5-325 MG PO TABS: 2.0000 | ORAL_TABLET | Freq: Four times a day (QID) | ORAL | 0 refills | Status: AC | PRN

## 2019-12-24 MED ORDER — IBUPROFEN 800 MG PO TABS: 800.0000 mg | ORAL_TABLET | Freq: Four times a day (QID) | ORAL | 0 refills | Status: AC | PRN

## 2019-12-24 MED ORDER — IBUPROFEN 800 MG PO TABS
800.0000 mg | ORAL_TABLET | Freq: Once | ORAL | Status: AC
  Administered 2019-12-24: 800 mg via ORAL
  Filled 2019-12-24: qty 1

## 2019-12-24 MED ORDER — TAMSULOSIN HCL 0.4 MG PO CAPS: 0.4000 mg | ORAL_CAPSULE | Freq: Every day | ORAL | 0 refills | Status: AC
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
## 2020-07-23 IMAGING — CT CT RENAL STONE PROTOCOL
2 of 4 series · 16 of 46 positions shown, 18 images · non-contrast
Comparison: None.

CLINICAL DATA: Flank pain.

EXAM:
CT ABDOMEN AND PELVIS WITHOUT CONTRAST
TECHNIQUE: Multidetector CT imaging of the abdomen and pelvis was performed
following the standard protocol without IV contrast.

[Series 2: axial st · axial · 0.75mm/px · z∈[-486,-66]mm · 13 of 96 slices shown, 15 images]
[im 6/96  soft-tissue]
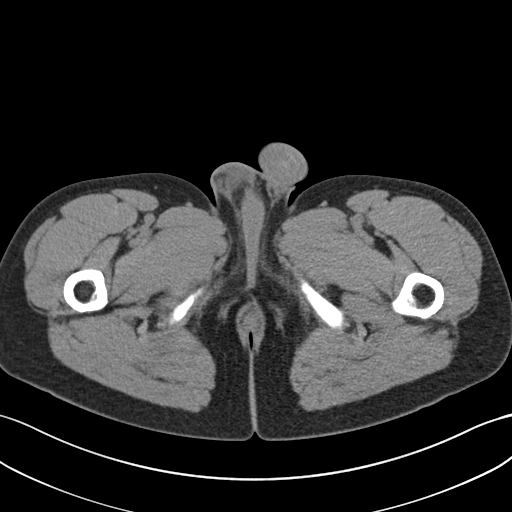
[im 6/96  bone]
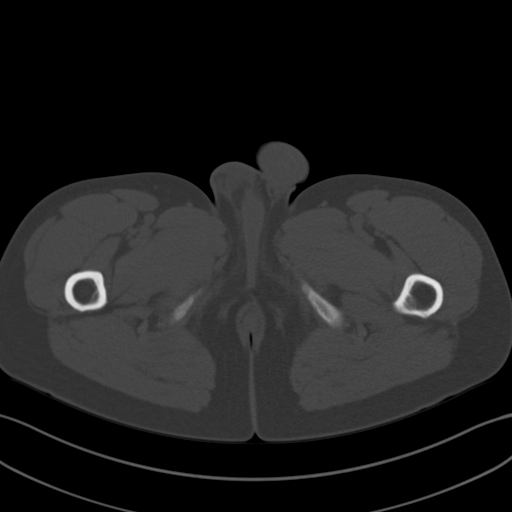
[im 11/96  soft-tissue]
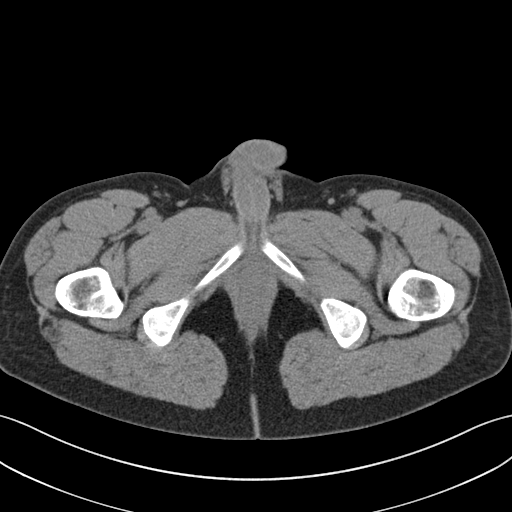
[im 22/96  soft-tissue]
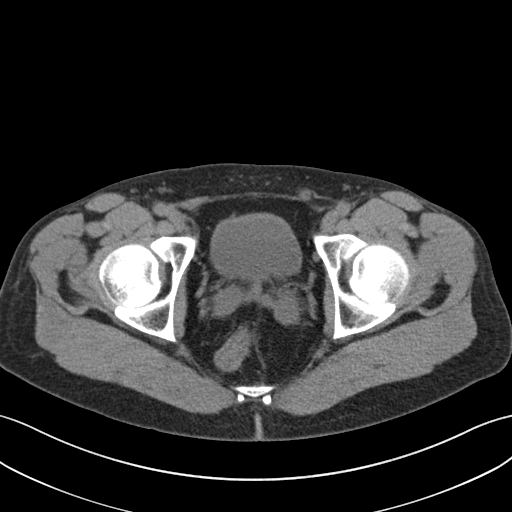
[im 27/96  soft-tissue]
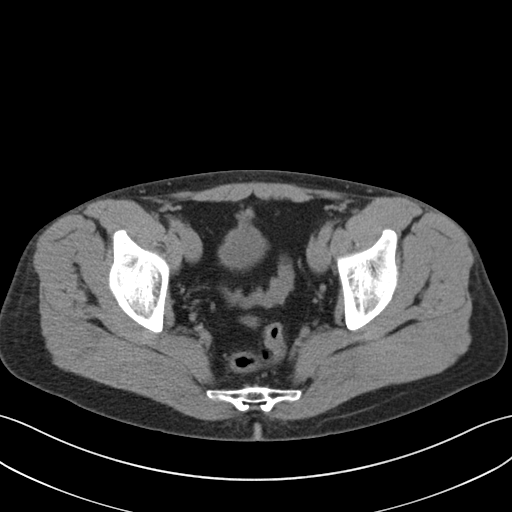
[im 32/96  soft-tissue]
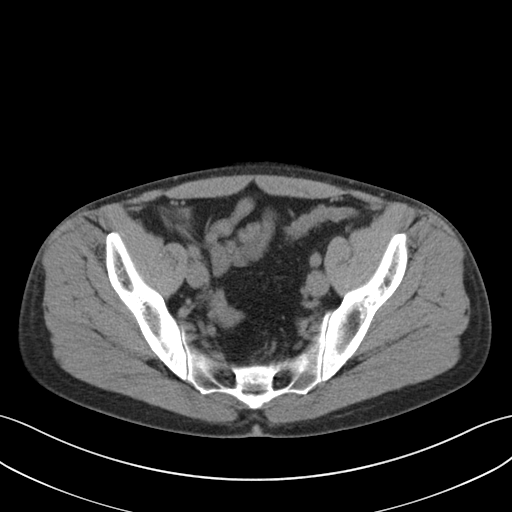
[im 43/96  soft-tissue]
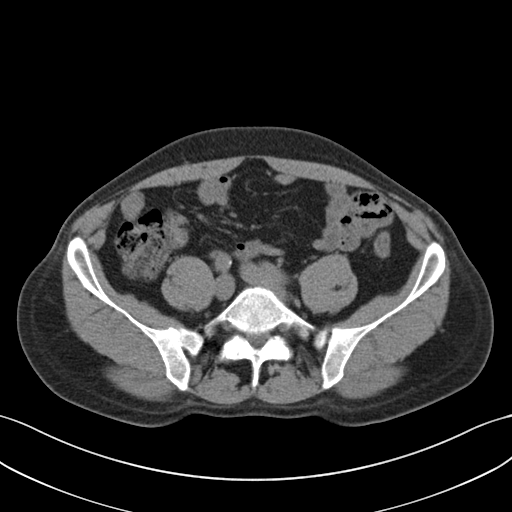
[im 48/96  soft-tissue]
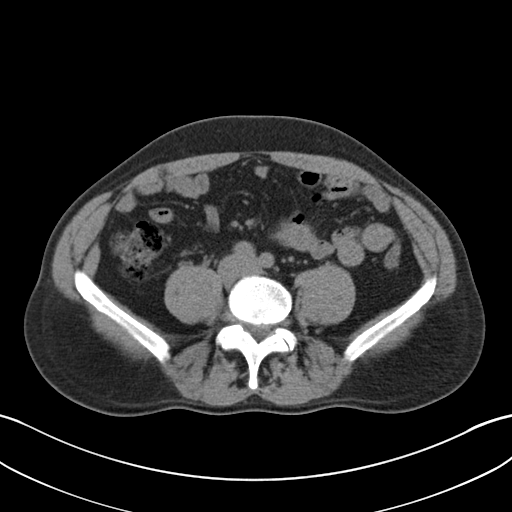
[im 53/96  soft-tissue]
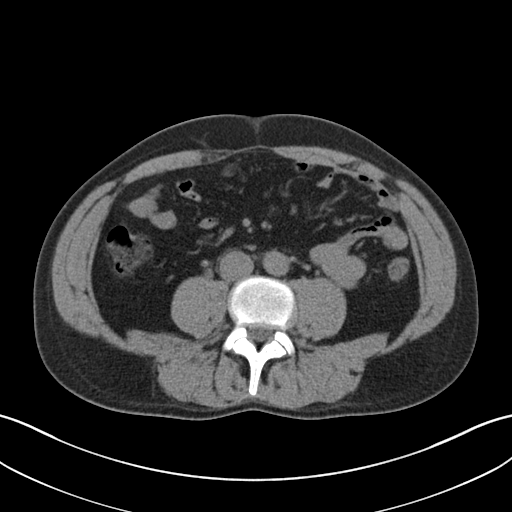
[im 64/96  soft-tissue]
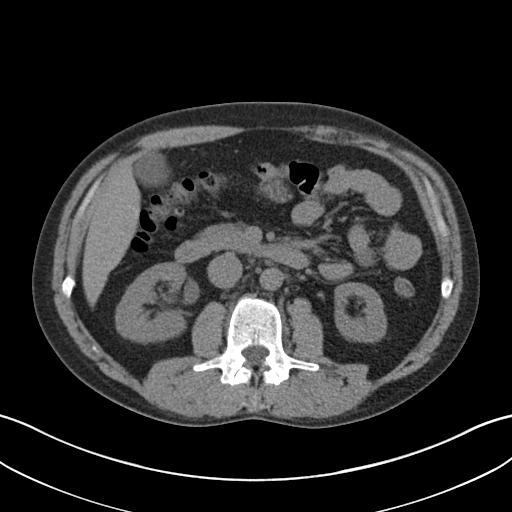
[im 64/96  bone]
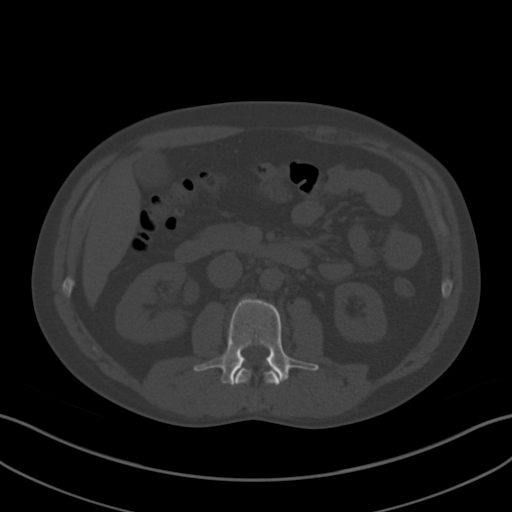
[im 69/96  soft-tissue]
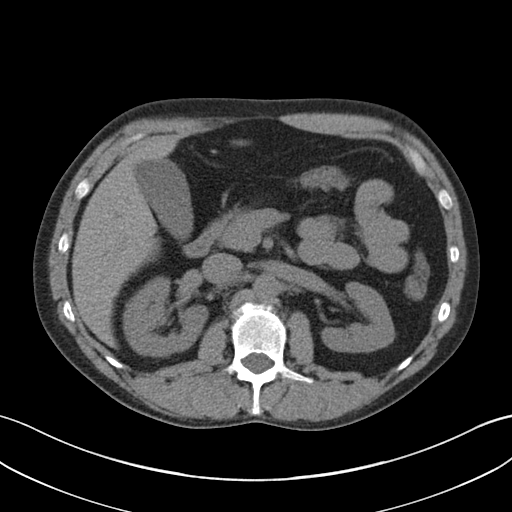
[im 74/96  soft-tissue]
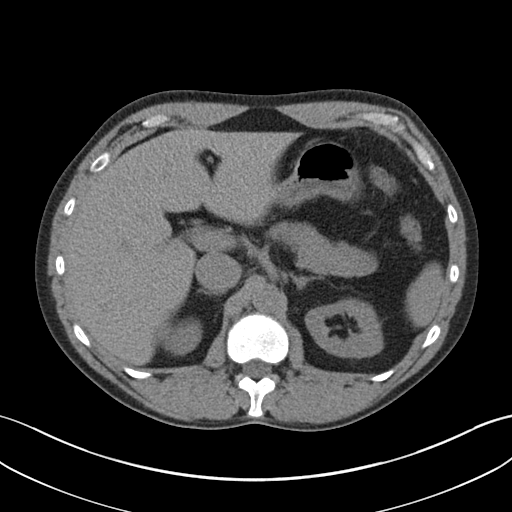
[im 85/96  soft-tissue]
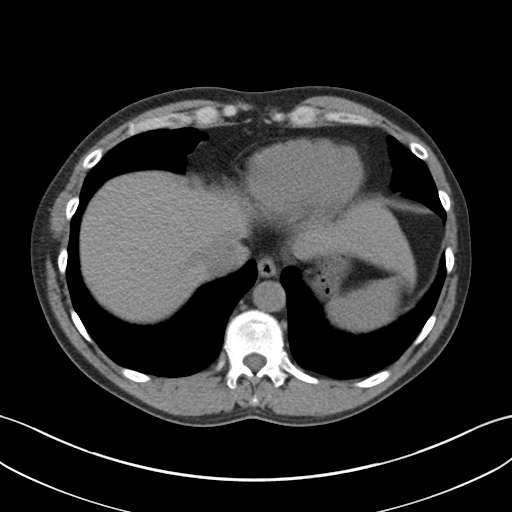
[im 90/96  soft-tissue]
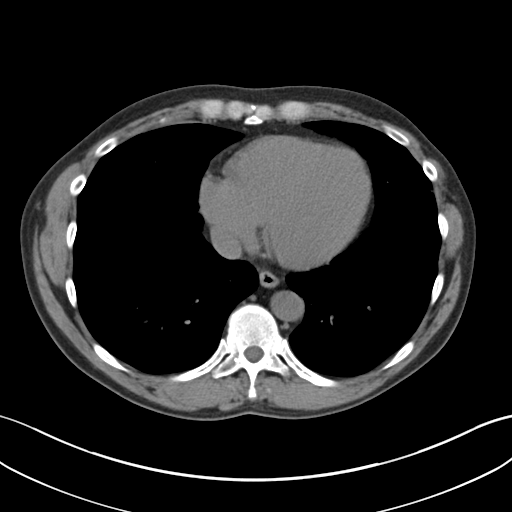

[Series 5: coronal · coronal · 0.75mm/px · 3 of 142 slices shown]
[im 48/142  soft-tissue]
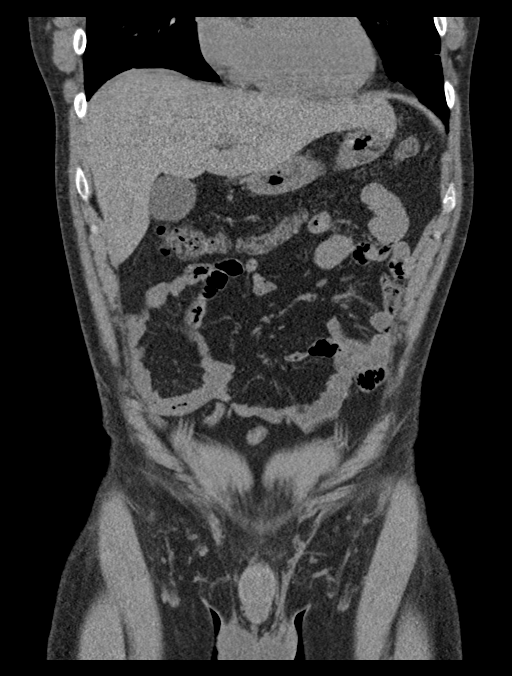
[im 63/142  soft-tissue]
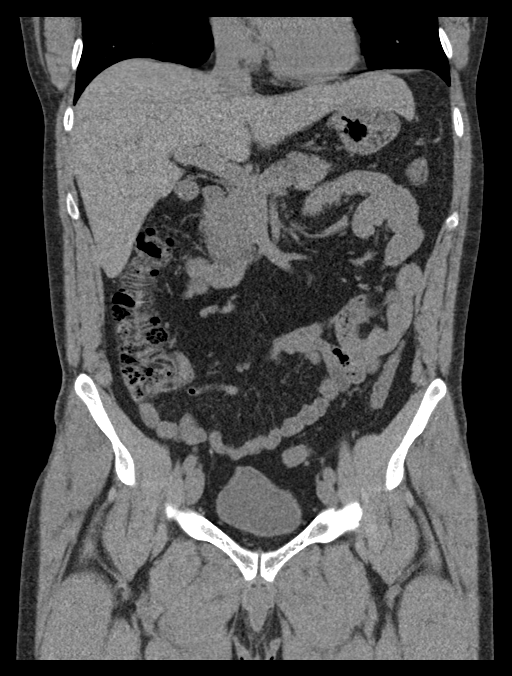
[im 79/142  soft-tissue]
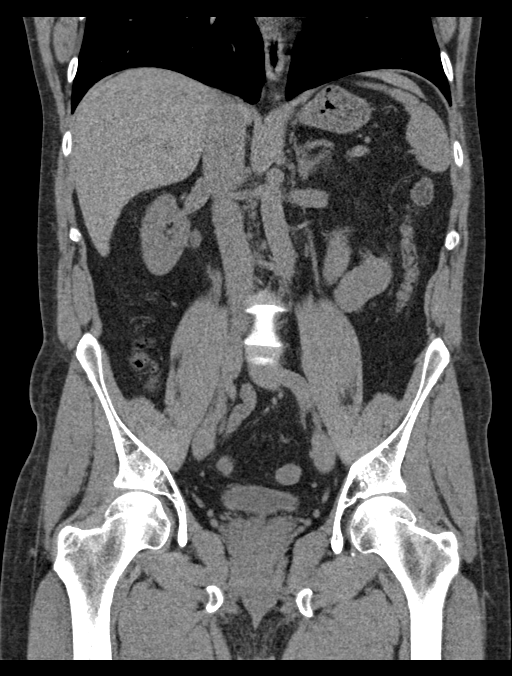

[16 of 46 positions shown; findings below may reference images not displayed]

FINDINGS: Lower chest: No acute abnormality.

Hepatobiliary: No focal liver abnormality is seen. No gallstones,
gallbladder wall thickening, or biliary dilatation.

Pancreas: Unremarkable. No pancreatic ductal dilatation or
surrounding inflammatory changes.

Spleen: Normal in size without focal abnormality.

Adrenals/Urinary Tract: Adrenal glands are unremarkable. Kidneys are
normal in size without focal lesions. A 4 mm obstructing renal stone
is seen within the distal right ureter with mild right-sided
hydronephrosis and hydroureter. 2 mm nonobstructing renal stones are
seen within both kidneys. Bladder is unremarkable.

Stomach/Bowel: A 2.6 cm x 2.7 cm gastric diverticulum is seen along
the posteromedial aspect of the gastric fundus. Appendix appears
normal. No evidence of bowel wall thickening, distention, or
inflammatory changes. Noninflamed diverticula are seen along the
ascending colon.

Vascular/Lymphatic: No significant vascular findings are present. No
enlarged abdominal or pelvic lymph nodes.

Reproductive: Prostate is unremarkable.

Other: No abdominal wall hernia or abnormality. No abdominopelvic
ascites.

Musculoskeletal: No acute or significant osseous findings.
IMPRESSION: 1. 4 mm obstructing renal stone within the distal right ureter with
mild right-sided hydronephrosis and hydroureter.
2. Bilateral nonobstructing renal stones.
3. Colonic diverticulosis.

## 2022-09-01 ENCOUNTER — Other Ambulatory Visit (HOSPITAL_COMMUNITY): Payer: Self-pay | Admitting: Hospitalist

## 2022-09-01 DIAGNOSIS — M25512 Pain in left shoulder: Secondary | ICD-10-CM

## 2022-09-11 ENCOUNTER — Ambulatory Visit (HOSPITAL_COMMUNITY)
Admission: RE | Admit: 2022-09-11 | Discharge: 2022-09-11 | Disposition: A | Discharge status: Home / Self Care | Payer: No Typology Code available for payment source | Source: Ambulatory Visit | Attending: Hospitalist | Admitting: Hospitalist

## 2022-09-11 DIAGNOSIS — M25512 Pain in left shoulder: Secondary | ICD-10-CM | POA: Insufficient documentation
